# Patient Record
Sex: Female | Born: 1980 | Race: Black or African American | Hispanic: No | Marital: Single | Smoking: Never smoker
Health system: Southern US, Community
[De-identification: ages and names within clinical notes are randomized; demographics above are authoritative.]

## PROBLEM LIST (undated history)

## (undated) ENCOUNTER — Inpatient Hospital Stay (HOSPITAL_COMMUNITY): Payer: Self-pay

## (undated) DIAGNOSIS — C801 Malignant (primary) neoplasm, unspecified: Secondary | ICD-10-CM

## (undated) DIAGNOSIS — R5383 Other fatigue: Secondary | ICD-10-CM

## (undated) DIAGNOSIS — E876 Hypokalemia: Secondary | ICD-10-CM

## (undated) DIAGNOSIS — R55 Syncope and collapse: Secondary | ICD-10-CM

## (undated) DIAGNOSIS — R569 Unspecified convulsions: Secondary | ICD-10-CM

## (undated) DIAGNOSIS — E611 Iron deficiency: Secondary | ICD-10-CM

## (undated) HISTORY — DX: Iron deficiency: E61.1

## (undated) HISTORY — DX: Hypokalemia: E87.6

## (undated) HISTORY — DX: Other fatigue: R53.83

## (undated) HISTORY — PX: NO PAST SURGERIES: SHX2092

---

## 2007-11-27 ENCOUNTER — Inpatient Hospital Stay (HOSPITAL_COMMUNITY): Admission: AD | Admit: 2007-11-27 | Discharge: 2007-11-27 | Payer: Self-pay | Admitting: Family Medicine

## 2010-01-07 ENCOUNTER — Emergency Department (HOSPITAL_COMMUNITY): Admission: EM | Admit: 2010-01-07 | Discharge: 2010-01-07 | Payer: Self-pay | Admitting: Emergency Medicine

## 2011-10-11 ENCOUNTER — Encounter (HOSPITAL_COMMUNITY): Payer: Self-pay | Admitting: Physical Medicine and Rehabilitation

## 2011-10-11 ENCOUNTER — Emergency Department (HOSPITAL_COMMUNITY): Payer: PRIVATE HEALTH INSURANCE

## 2011-10-11 ENCOUNTER — Emergency Department (HOSPITAL_COMMUNITY)
Admission: EM | Admit: 2011-10-11 | Discharge: 2011-10-11 | Disposition: A | Discharge status: Home / Self Care | Payer: PRIVATE HEALTH INSURANCE | Attending: Emergency Medicine | Admitting: Emergency Medicine

## 2011-10-11 DIAGNOSIS — R0602 Shortness of breath: Secondary | ICD-10-CM | POA: Insufficient documentation

## 2011-10-11 DIAGNOSIS — R0789 Other chest pain: Secondary | ICD-10-CM | POA: Insufficient documentation

## 2011-10-11 DIAGNOSIS — R079 Chest pain, unspecified: Secondary | ICD-10-CM

## 2011-10-11 LAB — COMPREHENSIVE METABOLIC PANEL
ALT: 9 U/L (ref 0–35)
BUN: 6 mg/dL (ref 6–23)
CO2: 24 mEq/L (ref 19–32)
Calcium: 9.1 mg/dL (ref 8.4–10.5)
Creatinine, Ser: 0.79 mg/dL (ref 0.50–1.10)
GFR calc Af Amer: >90 mL/min (ref >90)
GFR calc non Af Amer: >90 mL/min (ref >90)
Glucose, Bld: 61 mg/dL — ABNORMAL LOW (ref 70–99)
Sodium: 139 mEq/L (ref 135–145)

## 2011-10-11 LAB — CBC
HCT: 37.6 % (ref 36.0–46.0)
Hemoglobin: 12.6 g/dL (ref 12.0–15.0)
MCH: 29.4 pg (ref 26.0–34.0)
MCV: 87.6 fL (ref 78.0–100.0)
RBC: 4.29 MIL/uL (ref 3.87–5.11)

## 2011-10-11 LAB — D-DIMER, QUANTITATIVE: D-Dimer, Quant: 0.56 ug/mL-FEU — ABNORMAL HIGH (ref 0.00–0.48)

## 2011-10-11 MED ORDER — IOHEXOL 350 MG/ML SOLN
80.0000 mL | Freq: Once | INTRAVENOUS | Status: AC | PRN
  Administered 2011-10-11: 80 mL via INTRAVENOUS

## 2011-10-11 NOTE — ED Notes (Signed)
Pt presents to department for evaluation of diffuse chest pain and SOB. Pt states onset last night while resting at home. States she became diaphoretic, short of breath and began having diffuse chest heaviness. Describes as constant, rating pain at 4/10 at the time. Nothing makes pain worse. Respirations unlabored. Skin warm and dry. She is alert and oriented x4.

## 2011-10-11 NOTE — ED Provider Notes (Signed)
History     CSN: [id number]  Arrival date & time 10/11/11  4098   First MD Initiated Contact with Patient 10/11/11 [phone]      Chief Complaint  Patient presents with  . Chest Pain  . Shortness of Breath    (Consider location/radiation/quality/duration/timing/severity/associated sxs/prior treatment) Patient is a 31 y.o. female presenting with chest pain. The history is provided by the patient.  Chest Pain The chest pain began yesterday (left sided). Duration of episode(s) is 1 minute. Chest pain occurs intermittently. The chest pain is resolved (currently with vague discomfort, not painful, to same area). Associated with: nothing, began at rest. The severity of the pain is severe. The quality of the pain is described as squeezing. The pain does not radiate. Exacerbated by: nothing. Primary symptoms include shortness of breath. Pertinent negatives for primary symptoms include no fever, no syncope, no cough, no wheezing, no palpitations, no abdominal pain, no nausea, no vomiting and no dizziness.  Associated symptoms include diaphoresis.  Pertinent negatives for associated symptoms include no near-syncope. Lower extremity edema: intermittently to feet only, none at present. She tried nothing for the symptoms. Risk factors include oral contraceptive use.  Pertinent negatives for past medical history include no diabetes, no DVT, no hyperlipidemia, no hypertension, no PE and no recent injury. Past medical history comments: neg recent prolonged immobility or surgery  Her family medical history is significant for CAD in family (elderly grandparent).  Pertinent negatives for family medical history include: no early MI in family.     No past medical history on file.  No past surgical history on file.  No family history on file.  History  Substance Use Topics  . Smoking status: Never Smoker   . Smokeless tobacco: Not on file  . Alcohol Use: No     Review of Systems  Constitutional:  Positive for diaphoresis. Negative for fever.  Respiratory: Positive for shortness of breath. Negative for cough and wheezing.   Cardiovascular: Positive for chest pain. Negative for palpitations, syncope and near-syncope.  Gastrointestinal: Negative for nausea, vomiting and abdominal pain.  Genitourinary: Vaginal bleeding: on menses.  Neurological: Negative for dizziness.  All other systems reviewed and are negative.    Allergies  Review of patient's allergies indicates no known allergies.  Home Medications   Current Outpatient Rx  Name Route Sig Dispense Refill  . ETONOGESTREL-ETHINYL ESTRADIOL 0.12-0.015 MG/24HR VA RING Vaginal Place 1 each vaginally every 28 (twenty-eight) days. Insert vaginally and leave in place for 3 consecutive weeks, then remove for 1 week.      BP 166/94  Pulse 81  Temp 97 F (36.1 C) (Oral)  Resp 24  SpO2 96%  Physical Exam  Nursing note and vitals reviewed. Constitutional: She appears well-developed and well-nourished. No distress.  HENT:  Head: Normocephalic and atraumatic.  Right Ear: External ear normal.  Left Ear: External ear normal.  Mouth/Throat: Oropharynx is clear and moist.  Eyes: Conjunctivae are normal. Pupils are equal, round, and reactive to light.  Neck: Neck supple.  Cardiovascular: Normal rate, regular rhythm and normal heart sounds.   No murmur heard.      Bilateral radial and DP pulses are 2+  Pulmonary/Chest: Effort normal and breath sounds normal. No respiratory distress. She has no wheezes. She exhibits no tenderness.  Abdominal: Soft. Bowel sounds are normal. She exhibits no distension. There is no tenderness. There is no guarding.  Musculoskeletal:       Calves supple and non-tender  Neurological: She is alert.  Skin: Skin is warm and dry. She is not diaphoretic.  Psychiatric: She has a normal mood and affect.    ED Course  Procedures (including critical care time)  Labs Reviewed  COMPREHENSIVE METABOLIC PANEL -  Abnormal; Notable for the following:    Potassium 3.4 (*)     Glucose, Bld 61 (*)     Total Bilirubin 0.2 (*)     All other components within normal limits  D-DIMER, QUANTITATIVE - Abnormal; Notable for the following:    D-Dimer, Quant 0.56 (*)     All other components within normal limits  CBC  POCT I-STAT TROPONIN I   Dg Chest 2 View  10/11/2011  *RADIOLOGY REPORT*  Clinical Data: Chest pain  CHEST - 2 VIEW  Comparison: None  Findings: The heart size and mediastinal contours are within normal limits.  Both lungs are clear. There is a scoliosis deformity affecting the thoracic spine.  IMPRESSION: Negative exam.  Original Report Authenticated By: Rosealee Albee, M.D.   Ct Angio Chest W/cm &/or Wo Cm  10/11/2011  *RADIOLOGY REPORT*  Clinical Data: Chest pain and shortness of breath.  Evaluate for pulmonary embolism.  CT ANGIOGRAPHY CHEST  Technique:  Multidetector CT imaging of the chest using the standard protocol during bolus administration of intravenous contrast. Multiplanar reconstructed images including MIPs were obtained and reviewed to evaluate the vascular anatomy.  Contrast: 80mL OMNIPAQUE IOHEXOL 350 MG/ML SOLN  Comparison: No priors.  Findings:  Mediastinum: No filling defects within the pulmonary arterial tree to suggest underlying pulmonary embolism. Heart size is normal. There is no significant pericardial fluid, thickening or pericardial calcification. No pathologically enlarged mediastinal or hilar lymph nodes. Esophagus is unremarkable in appearance.  Lungs/Pleura: No acute consolidative airspace disease.  No pleural effusions.  No suspicious appearing pulmonary nodules or masses.  Upper Abdomen: Unremarkable.  Musculoskeletal: There are no aggressive appearing lytic or blastic lesions noted in the visualized portions of the skeleton.  IMPRESSION: 1.  No evidence of pulmonary embolism. 2.  No acute findings in the thorax to account for the patient's symptoms.  Original Report  Authenticated By: Florencia Reasons, M.D.     Dx 1: Chest pain   MDM  31 y/o F with atypical CP, no known cardiac risk factors. EKG normal, please see attending MD note for documentation. CXR normal. Labs are reviewed, slight hypoglycemia, slight hypokalemia (not requiring supplementation), slightly elevated D-dimer. Given RF for PE of hormonal birth control, CT angio of the chest is performed and shows no pulmonary embolism. Discussed all results with patient. She will be d/c home and agrees to follow-up with primary care.        Shaaron Adler, PA-C 10/11/11 1124

## 2011-10-11 NOTE — ED Provider Notes (Signed)
ECG shows normal sinus rhythm with a rate of 62, no ectopy. Normal axis. Normal P wave. Normal QRS. Normal intervals. Normal ST and T waves. Impression: normal ECG. No old ECG available for comparison.   Dione Booze, MD 10/11/11 [phone]

## 2011-10-12 NOTE — ED Provider Notes (Signed)
Medical screening examination/treatment/procedure(s) were conducted as a shared visit with non-physician practitioner(s) and myself.  I personally evaluated the patient during the encounter   Kyleena Scheirer, MD 10/12/11 2149 

## 2011-11-16 ENCOUNTER — Encounter (HOSPITAL_COMMUNITY): Payer: Self-pay | Admitting: *Deleted

## 2011-11-16 ENCOUNTER — Emergency Department (HOSPITAL_COMMUNITY)
Admission: EM | Admit: 2011-11-16 | Discharge: 2011-11-16 | Disposition: A | Discharge status: Home / Self Care | Payer: Self-pay | Attending: Emergency Medicine | Admitting: Emergency Medicine

## 2011-11-16 DIAGNOSIS — R55 Syncope and collapse: Secondary | ICD-10-CM | POA: Insufficient documentation

## 2011-11-16 HISTORY — DX: Syncope and collapse: R55

## 2011-11-16 HISTORY — DX: Unspecified convulsions: R56.9

## 2011-11-16 LAB — URINALYSIS, ROUTINE W REFLEX MICROSCOPIC
Bilirubin Urine: NEGATIVE
Ketones, ur: NEGATIVE mg/dL
Nitrite: NEGATIVE
pH: 6 (ref 5.0–8.0)

## 2011-11-16 LAB — URINE MICROSCOPIC-ADD ON

## 2011-11-16 LAB — POCT I-STAT, CHEM 8
Chloride: 101 mEq/L (ref 96–112)
Creatinine, Ser: 0.9 mg/dL (ref 0.50–1.10)
Glucose, Bld: 85 mg/dL (ref 70–99)
Potassium: 3.8 mEq/L (ref 3.5–5.1)

## 2011-11-16 MED ORDER — SODIUM CHLORIDE 0.9 % IV BOLUS (SEPSIS)
1000.0000 mL | Freq: Once | INTRAVENOUS | Status: AC
  Administered 2011-11-16: 1000 mL via INTRAVENOUS

## 2011-11-16 MED ORDER — ONDANSETRON HCL 4 MG/2ML IJ SOLN
INTRAMUSCULAR | Status: AC
  Filled 2011-11-16: qty 2

## 2011-11-16 NOTE — ED Provider Notes (Signed)
Medical screening examination/treatment/procedure(s) were conducted as a shared visit with non-physician practitioner(s) and myself.  I personally evaluated the patient during the encounter  Syncope with prodrome of nausea. No chest pain or SOB. No Brugada or prolonged QT.  Glynn Octave, MD 11/16/11 [phone]

## 2011-11-16 NOTE — ED Notes (Signed)
States saw PCP yesterday, spent all day at office with tests, etc. Given IV fluids & told by MD she was dehydrated, sleep deprived, needed a better diet & was believed that it was stress related. Denies CP, palpitations, dizziness. C/o nausea all day, no emesis. Denies any injuries from syncopal episodes today

## 2011-11-16 NOTE — ED Provider Notes (Signed)
History     CSN: [id number]  Arrival date & time 11/16/11  1447   First MD Initiated Contact with Patient 11/16/11 1502      Chief Complaint  Patient presents with  . Loss of Consciousness    (Consider location/radiation/quality/duration/timing/severity/associated sxs/prior treatment) HPI Comments: Patient presents with a syncopal episode x2 today. Patient states that she had a syncopal episode preceded by a wave of nausea 5 days ago. She followed up with her primary care physician yesterday for this and had several tests done. Patient states that she had a negative pregnancy test, blood tests, and EKG. She was given IV fluids. The patient states that her doctor attributed her symptoms to stress and fatigue, instructed her to rest more and eat better. Patient had a syncopal episode just after lunch today while she was at rest. She denies a prodrome however she states that she had felt nauseous all day long. She denies chest pain, palpitations, shortness of breath, or headache. Patient denies injury from fall. Seconds episode was witnessed by EMS. Patient only remembers one episode. Patient's had recent visit and CT angiogram rule out PE on 10/12/2011 after having an episode of chest pain. This was negative. Patient denies family history of heart problems or arrhythmia. No history of cardiac death at young age. Patient otherwise denies medical complaints. She his a runner and has not had any symptoms while exercising. She states that she has been stressed out recently. Onset was acute. Course is resolved. Nothing makes symptoms better or worse.   Patient is a 31 y.o. female presenting with syncope. The history is provided by the patient.  Loss of Consciousness This is a new problem. The current episode started today. The problem occurs daily. The problem has been resolved. Associated symptoms include fatigue and nausea. Pertinent negatives include no abdominal pain, chest pain, coughing, fever,  headaches, myalgias, neck pain, numbness, rash, sore throat, vertigo, visual change, vomiting or weakness. Nothing aggravates the symptoms. She has tried nothing for the symptoms.    Past Medical History  Diagnosis Date  . Syncope   . Seizures     History reviewed. No pertinent past surgical history.  No family history on file.  History  Substance Use Topics  . Smoking status: Never Smoker   . Smokeless tobacco: Not on file  . Alcohol Use: No    OB History    Grav Para Term Preterm Abortions TAB SAB Ect Mult Living                  Review of Systems  Constitutional: Positive for fatigue. Negative for fever.  HENT: Negative for sore throat, rhinorrhea, neck pain and neck stiffness.   Eyes: Negative for redness.  Respiratory: Negative for cough, chest tightness and shortness of breath.   Cardiovascular: Positive for syncope. Negative for chest pain, palpitations and leg swelling.  Gastrointestinal: Positive for nausea. Negative for vomiting, abdominal pain and diarrhea.  Genitourinary: Negative for dysuria and vaginal bleeding.  Musculoskeletal: Negative for myalgias.  Skin: Negative for rash.  Neurological: Positive for syncope. Negative for vertigo, seizures, facial asymmetry, speech difficulty, weakness, light-headedness, numbness and headaches.  Psychiatric/Behavioral: Negative for confusion.    Allergies  Review of patient's allergies indicates no known allergies.  Home Medications   Current Outpatient Rx  Name Route Sig Dispense Refill  . VITAMIN D PO Oral Take 1 tablet by mouth daily.    . ETONOGESTREL-ETHINYL ESTRADIOL 0.12-0.015 MG/24HR VA RING Vaginal Place 1 each vaginally every  28 (twenty-eight) days. Insert vaginally and leave in place for 3 consecutive weeks, then remove for 1 week.    Marland Kitchen FERROUS SULFATE 325 (65 FE) MG PO TABS Oral Take 325 mg by mouth daily with breakfast.    . ADULT MULTIVITAMIN W/MINERALS CH Oral Take 1 tablet by mouth daily.      BP  126/90  Pulse 73  Temp 98.2 F (36.8 C)  Resp 18  SpO2 100%  LMP 11/13/2011  Physical Exam  Nursing note and vitals reviewed. Constitutional: She is oriented to person, place, and time. She appears well-developed and well-nourished.  HENT:  Head: Normocephalic and atraumatic.  Eyes: Conjunctivae are normal. Pupils are equal, round, and reactive to light. Right eye exhibits no discharge. Left eye exhibits no discharge.  Neck: Normal range of motion. Neck supple.  Cardiovascular: Normal rate, regular rhythm, normal heart sounds and intact distal pulses.  Exam reveals no gallop and no friction rub.   No murmur heard. Pulses:      Radial pulses are 2+ on the right side, and 2+ on the left side.  Pulmonary/Chest: Effort normal and breath sounds normal. No respiratory distress. She has no wheezes. She has no rales.  Abdominal: Soft. There is no tenderness. There is no rebound and no guarding.  Musculoskeletal: She exhibits no edema and no tenderness.  Neurological: She is alert and oriented to person, place, and time. She has normal strength. No cranial nerve deficit or sensory deficit. Coordination and gait normal. GCS eye subscore is 4. GCS verbal subscore is 5. GCS motor subscore is 6.  Skin: Skin is warm and dry.  Psychiatric: She has a normal mood and affect.    ED Course  Procedures (including critical care time)  Labs Reviewed  URINALYSIS, ROUTINE W REFLEX MICROSCOPIC - Abnormal; Notable for the following:    Hgb urine dipstick SMALL (*)     Protein, ur 30 (*)     All other components within normal limits  POCT I-STAT, CHEM 8 - Abnormal; Notable for the following:    BUN 5 (*)     Calcium, Ion 1.26 (*)     All other components within normal limits  URINE MICROSCOPIC-ADD ON - Abnormal; Notable for the following:    Squamous Epithelial / LPF FEW (*)     Casts HYALINE CASTS (*)     All other components within normal limits  PREGNANCY, URINE   No results found.   1.  Syncope     3:21 PM Patient seen and examined. Work-up initiated. Will monitor. Previous ED visit reviewed.   Vital signs reviewed and are as follows: Filed Vitals:   11/16/11 1519  BP: 126/90  Pulse: 73  Temp:   Resp: 18    Date: 11/16/2011  Rate: 85  Rhythm: normal sinus rhythm  QRS Axis: normal  Intervals: normal  ST/T Wave abnormalities: early repolarization  Conduction Disutrbances:none  Narrative Interpretation: No obvious Brugada, no QTc prolongation, no WPW.   Old EKG Reviewed: none available -- no scan from 10/2011, interpretation by EDP normal.   4:18 PM Curbside Dr. Ladona Ridgel, agrees no concerning findings on EKG. Dr. Manus Gunning has seen.  5:31 PM Work-up largely negative. 1L NS given due to orthostatic vitals. Patient informed of results. She was urged to followup with her primary care physician given another episode of syncope, change from previous. Will also give cardiology referral. Patient urged to return if she has another episode that is different than her previous or she  has any other concerns. Patient verbalizes understanding and agrees with this plan. I urged her to hydrate well and rest.   MDM  Patient with multiple syncopal episodes over the past week. She has had PCP followup with no concerning findings. Workup here today is largely unconcerning. Mild orthostasis, fluids given. Patient is stable. No abnormalities while on cardiac monitor. EKG did not show elevated QTC, Brugada syndrome, Wolff-Parkinson-White, or any other significant abnormalities. Patient is stable for discharge home. Do not suspect pulmonary embolism. Neg recent workup, no chest pain, no tachycardia, shortness of breath.        [place], [place] 11/16/11 [phone]

## 2011-11-16 NOTE — ED Notes (Addendum)
Pt reports had a syncopal epiasode while at work. C/o nausea x 2 weeks. Had another syncopal episode last Saturday. Saw PCP yesterday for same. Denies CP, palpitations. Pt admits to being under a lot of stress lately. Given zofran 4mg  IV per EMS

## 2012-04-17 ENCOUNTER — Encounter (HOSPITAL_COMMUNITY): Payer: Self-pay | Admitting: *Deleted

## 2012-04-17 ENCOUNTER — Emergency Department (HOSPITAL_COMMUNITY)
Admission: EM | Admit: 2012-04-17 | Discharge: 2012-04-17 | Disposition: A | Discharge status: Home / Self Care | Payer: Self-pay | Attending: Emergency Medicine | Admitting: Emergency Medicine

## 2012-04-17 DIAGNOSIS — R55 Syncope and collapse: Secondary | ICD-10-CM | POA: Insufficient documentation

## 2012-04-17 DIAGNOSIS — R51 Headache: Secondary | ICD-10-CM | POA: Insufficient documentation

## 2012-04-17 DIAGNOSIS — Z79899 Other long term (current) drug therapy: Secondary | ICD-10-CM | POA: Insufficient documentation

## 2012-04-17 DIAGNOSIS — Z3202 Encounter for pregnancy test, result negative: Secondary | ICD-10-CM | POA: Insufficient documentation

## 2012-04-17 DIAGNOSIS — Z859 Personal history of malignant neoplasm, unspecified: Secondary | ICD-10-CM | POA: Insufficient documentation

## 2012-04-17 HISTORY — DX: Malignant (primary) neoplasm, unspecified: C80.1

## 2012-04-17 LAB — CBC WITH DIFFERENTIAL/PLATELET
Basophils Absolute: 0 10*3/uL (ref 0.0–0.1)
Eosinophils Relative: 0 % (ref 0–5)
Lymphocytes Relative: 23 % (ref 12–46)
MCV: 86.5 fL (ref 78.0–100.0)
Neutro Abs: 4.6 10*3/uL (ref 1.7–7.7)
Neutrophils Relative %: 73 % (ref 43–77)
Platelets: 340 10*3/uL (ref 150–400)
RBC: 4.37 MIL/uL (ref 3.87–5.11)
RDW: 13.8 % (ref 11.5–15.5)
WBC: 6.4 10*3/uL (ref 4.0–10.5)

## 2012-04-17 LAB — BASIC METABOLIC PANEL
CO2: 23 mEq/L (ref 19–32)
Calcium: 9.2 mg/dL (ref 8.4–10.5)
GFR calc non Af Amer: >90 mL/min (ref >90)
Potassium: 3.5 mEq/L (ref 3.5–5.1)
Sodium: 137 mEq/L (ref 135–145)

## 2012-04-17 LAB — POCT PREGNANCY, URINE: Preg Test, Ur: NEGATIVE

## 2012-04-17 MED ORDER — SODIUM CHLORIDE 0.9 % IV BOLUS (SEPSIS)
500.0000 mL | Freq: Once | INTRAVENOUS | Status: AC
  Administered 2012-04-17: 500 mL via INTRAVENOUS

## 2012-04-17 NOTE — ED Notes (Signed)
MD at bedside to update patient

## 2012-04-17 NOTE — ED Notes (Signed)
Pt in via EMS, per EMS, pt in s/p syncopal episode at work, states she was standing with a co-worker and they state she started to wobble, was caught and assisted to ground by co-worker, pt was unresponsive upon EMS arrival with GCS of 4. Within a few minutes pt was alert and oriented. Pt states she has eaten today, denies being sick, states she had a headache this am.

## 2012-04-17 NOTE — ED Notes (Signed)
D/C instructions reviewed with patient, denies questions, pt home with ride.

## 2012-04-17 NOTE — ED Provider Notes (Signed)
History     CSN: [id number]  Arrival date & time 04/17/12  1250   First MD Initiated Contact with Patient 04/17/12 1254      Chief Complaint  Patient presents with  . Loss of Consciousness    (Consider location/radiation/quality/duration/timing/severity/associated sxs/prior treatment) HPI Comments: Patient comes to the ER for evaluation of syncope. Patient reports that she had an episode of sudden loss of consciousness while at work earlier today. She says that she was performing her normal job when she suddenly started feeling weak. Bystanders that she started to walk behind a coworker grabbed her and later to the ground. The patient was reportedly unresponsive for several minutes. Upon EMS arrival she was completely unresponsive but rapidly aroused and is now back to her normal baseline. Patient reports that this happened several months ago she had a very for followup including neurology consult and there were no findings.  Does report that she woke up with a slight headache this morning. She says she thought that she was mildly dehydrated and has been drinking a lot of water. She has not had any blurred vision. No chest pain, shortness of breath, abdominal pain, nausea, vomiting or diarrhea.  Patient is a 32 y.o. female presenting with syncope.  Loss of Consciousness Associated symptoms include headaches.    Past Medical History  Diagnosis Date  . Syncope   . Seizures   . Cancer     History reviewed. No pertinent past surgical history.  History reviewed. No pertinent family history.  History  Substance Use Topics  . Smoking status: Never Smoker   . Smokeless tobacco: Not on file  . Alcohol Use: No    OB History    Grav Para Term Preterm Abortions TAB SAB Ect Mult Living                  Review of Systems  Cardiovascular: Positive for syncope.  Neurological: Positive for syncope and headaches.  All other systems reviewed and are negative.    Allergies  Review  of patient's allergies indicates no known allergies.  Home Medications   Current Outpatient Rx  Name  Route  Sig  Dispense  Refill  . VITAMIN D PO   Oral   Take 1 tablet by mouth daily.         . ETONOGESTREL-ETHINYL ESTRADIOL 0.12-0.015 MG/24HR VA RING   Vaginal   Place 1 each vaginally every 28 (twenty-eight) days. Insert vaginally and leave in place for 3 consecutive weeks, then remove for 1 week.         Marland Kitchen FERROUS SULFATE 325 (65 FE) MG PO TABS   Oral   Take 325 mg by mouth daily with breakfast.         . ADULT MULTIVITAMIN W/MINERALS CH   Oral   Take 1 tablet by mouth daily.           BP 137/88  Pulse 100  Temp 98.5 F (36.9 C) (Oral)  Resp 20  SpO2 100%  Physical Exam  Constitutional: She is oriented to person, place, and time. She appears well-developed and well-nourished. No distress.  HENT:  Head: Normocephalic and atraumatic.  Right Ear: Hearing normal.  Nose: Nose normal.  Mouth/Throat: Oropharynx is clear and moist and mucous membranes are normal.  Eyes: Conjunctivae normal and EOM are normal. Pupils are equal, round, and reactive to light.  Neck: Normal range of motion. Neck supple.  Cardiovascular: Normal rate, regular rhythm, S1 normal and S2 normal.  Exam reveals no gallop and no friction rub.   No murmur heard. Pulmonary/Chest: Effort normal and breath sounds normal. No respiratory distress. She exhibits no tenderness.  Abdominal: Soft. Normal appearance and bowel sounds are normal. There is no hepatosplenomegaly. There is no tenderness. There is no rebound, no guarding, no tenderness at McBurney's point and negative Murphy's sign. No hernia.  Musculoskeletal: Normal range of motion.  Neurological: She is alert and oriented to person, place, and time. She has normal strength. No cranial nerve deficit or sensory deficit. Coordination normal. GCS eye subscore is 4. GCS verbal subscore is 5. GCS motor subscore is 6.  Skin: Skin is warm, dry and  intact. No rash noted. No cyanosis.  Psychiatric: She has a normal mood and affect. Her speech is normal and behavior is normal. Thought content normal.    ED Course  Procedures (including critical care time)  Labs Reviewed - No data to display No results found.   Diagnosis: Syncope    MDM  Patient presents to the ER for evaluation of syncope. Patient has a previous history of similar episodes. He was briefly unresponsive but now is back to her normal baseline. She has a normal examination here in the ER. Basic labs were unremarkable. Patient has been observed here in the ER for a period of time and continues to do well. She'll be discharged home, followup as needed.       Gilda Crease, MD 04/17/12 [phone]

## 2012-06-08 ENCOUNTER — Emergency Department (HOSPITAL_COMMUNITY)
Admission: EM | Admit: 2012-06-08 | Discharge: 2012-06-08 | Discharge status: Against Medical Advice | Payer: Self-pay | Attending: Emergency Medicine | Admitting: Emergency Medicine

## 2012-06-08 DIAGNOSIS — R55 Syncope and collapse: Secondary | ICD-10-CM | POA: Insufficient documentation

## 2012-06-08 NOTE — ED Notes (Signed)
As getting report pt ambulatory to nurses station stating she does not wish to stay "I feel fine, and I will follow up with my doctor". RN explained that she will be signing out against medical advice. Dr. Rubin Payor notified and spoke to pt as well. Risks and benefits explained and pt verbalized understanding.

## 2012-06-08 NOTE — ED Notes (Signed)
Per ems- pt with hx of ovarian cancer and taking chemo pill. States that these episodes have been happening lately but everytime everything checks out okay. Episodes only last a few seconds with dizziness right before. States its usually when her bp gets low however ems bp was elevated. No orthostatic changes. Pt nsr. 20 G L AC.

## 2012-06-08 NOTE — ED Provider Notes (Signed)
MSE was initiated and I personally evaluated the patient and placed orders (if any) at  10:25 PM on June 08, 2012.  Patient was brought in after syncope. She does not want any further evaluation. She states she's been worked up for this before. She is aware that this could be something life-threatening and she does not want further evaluation. She appears awake and appropriate appears to have the capacity to make this decision. She is leaving AGAINST MEDICAL ADVICE  Juliet Rude. Rubin Payor, MD 06/08/12 2226

## 2012-08-08 ENCOUNTER — Emergency Department (HOSPITAL_COMMUNITY)
Admission: EM | Admit: 2012-08-08 | Discharge: 2012-08-08 | Disposition: A | Discharge status: Home / Self Care | Payer: Self-pay | Attending: Emergency Medicine | Admitting: Emergency Medicine

## 2012-08-08 ENCOUNTER — Encounter (HOSPITAL_COMMUNITY): Payer: Self-pay | Admitting: Emergency Medicine

## 2012-08-08 DIAGNOSIS — R55 Syncope and collapse: Secondary | ICD-10-CM | POA: Insufficient documentation

## 2012-08-08 DIAGNOSIS — Z859 Personal history of malignant neoplasm, unspecified: Secondary | ICD-10-CM | POA: Insufficient documentation

## 2012-08-08 NOTE — ED Notes (Signed)
Pt was at work this am and was found having seizure activity; twitching eye movement.  Non-responsive to light.  Last approx. 20 minutes.  Pt has recent hx of seizures approx. Every 3 months.  No seizure diagnosis.   Pt went to [facility] this AM to receive radiation and was unable to be completed d/t high BP.

## 2012-08-08 NOTE — ED Provider Notes (Signed)
History     CSN: [id number]  Arrival date & time 08/08/12  1252   First MD Initiated Contact with Patient 08/08/12 1333      Chief Complaint  Patient presents with  . Seizures    (Consider location/radiation/quality/duration/timing/severity/associated sxs/prior treatment) HPI Comments: Marcia Hanson is a 32 y.o. Female was at work today when she passed out. Coworkers found her in the bathroom unconscious. They carried her into another room. EMS arrived and found her unconscious. She appeared to have fluttering eyelids, at that time. She did not respond to painful stimuli. During transport to the ER, she suddenly woke up. There was no gradual improving mental status. The paramedic that was attending her described, "it was like a light switch turned on". The patient presents to the ED asymptomatic. She feels back to her normal baseline.she has had frequent episodes like this. She states that she has had 2 EEGs done in the last year by a neurologist. These EEGs were normal. She states that she ate twice the day prior to the episode. She denies recent illnesses, including fever, chills, nausea, vomiting, weakness, dizziness, abnormal vaginal bleeding, dysuria, or change in bowel habits. She has NuvaRing protection for pregnancy. There are no modifying factors.  Patient is a 32 y.o. female presenting with seizures. The history is provided by the patient.  Seizures   Past Medical History  Diagnosis Date  . Syncope   . Seizures   . Cancer     No past surgical history on file.  No family history on file.  History  Substance Use Topics  . Smoking status: Never Smoker   . Smokeless tobacco: Not on file  . Alcohol Use: No    OB History   Grav Para Term Preterm Abortions TAB SAB Ect Mult Living                  Review of Systems  Neurological: Positive for seizures.  All other systems reviewed and are negative.    Allergies  Avocado  Home Medications   Current  Outpatient Rx  Name  Route  Sig  Dispense  Refill  . etonogestrel-ethinyl estradiol (NUVARING) 0.12-0.015 MG/24HR vaginal ring   Vaginal   Place 1 each vaginally every 28 (twenty-eight) days. Insert vaginally and leave in place for 3 consecutive weeks, then remove for 1 week.           BP 150/102  Pulse 80  Temp(Src) 98 F (36.7 C) (Oral)  Resp 18  SpO2 99%  Physical Exam  Nursing note and vitals reviewed. Constitutional: She is oriented to person, place, and time. She appears well-developed and well-nourished.  HENT:  Head: Normocephalic and atraumatic.  Eyes: Conjunctivae and EOM are normal. Pupils are equal, round, and reactive to light.  Neck: Normal range of motion and phonation normal. Neck supple.  Cardiovascular: Normal rate, regular rhythm and intact distal pulses.   Pulmonary/Chest: Effort normal and breath sounds normal. She exhibits no tenderness.  Abdominal: Soft. She exhibits no distension. There is no tenderness. There is no guarding.  Musculoskeletal: Normal range of motion.  Neurological: She is alert and oriented to person, place, and time. She has normal strength. She exhibits normal muscle tone.  Skin: Skin is warm and dry.  Psychiatric: She has a normal mood and affect. Her behavior is normal. Judgment and thought content normal.    ED Course  Procedures (including critical care time)  Reevaluation: 15:20- no seizure, and no change in status during the  ED evaluation.   Date: 1300  Rate: 81  Rhythm: normal sinus rhythm  QRS Axis: normal  PR and QT Intervals: normal  ST/T Wave abnormalities: early repolarization  PR and QRS Conduction Disutrbances:none  Narrative Interpretation:   Old EKG Reviewed: unchanged-11/16/11       1. Syncope       MDM  Syncope, recurrent; previously evaluated. No clear cause for syncope today. Doubt seizure, CVA, iron depletion, metabolic process or infectious process. She is stable for discharge  Nursing Notes  Reviewed/ Care Coordinated, and agree without changes. Applicable Imaging Reviewed.  Interpretation of Laboratory Data incorporated into ED treatment   Plan: Home Medications- Tylenol, when necessary; Home Treatments- rest, fluids; Recommended follow up- PCP, one week for checkup          Flint Melter, MD 08/08/12 1524

## 2012-08-08 NOTE — ED Notes (Signed)
MD at bedside. 

## 2014-05-19 ENCOUNTER — Emergency Department: Payer: Self-pay | Admitting: Emergency Medicine

## 2014-08-24 ENCOUNTER — Encounter: Payer: Self-pay | Admitting: Emergency Medicine

## 2014-08-24 ENCOUNTER — Emergency Department
Admission: EM | Admit: 2014-08-24 | Discharge: 2014-08-24 | Disposition: A | Discharge status: Home / Self Care | Payer: Self-pay | Attending: Emergency Medicine | Admitting: Emergency Medicine

## 2014-08-24 DIAGNOSIS — T783XXA Angioneurotic edema, initial encounter: Secondary | ICD-10-CM | POA: Insufficient documentation

## 2014-08-24 DIAGNOSIS — Z793 Long term (current) use of hormonal contraceptives: Secondary | ICD-10-CM | POA: Insufficient documentation

## 2014-08-24 DIAGNOSIS — Y9389 Activity, other specified: Secondary | ICD-10-CM | POA: Insufficient documentation

## 2014-08-24 DIAGNOSIS — X58XXXA Exposure to other specified factors, initial encounter: Secondary | ICD-10-CM | POA: Insufficient documentation

## 2014-08-24 DIAGNOSIS — Y998 Other external cause status: Secondary | ICD-10-CM | POA: Insufficient documentation

## 2014-08-24 DIAGNOSIS — Y9289 Other specified places as the place of occurrence of the external cause: Secondary | ICD-10-CM | POA: Insufficient documentation

## 2014-08-24 DIAGNOSIS — Z79899 Other long term (current) drug therapy: Secondary | ICD-10-CM | POA: Insufficient documentation

## 2014-08-24 MED ORDER — SODIUM CHLORIDE 0.9 % IV BOLUS (SEPSIS)
500.0000 mL | Freq: Once | INTRAVENOUS | Status: AC
  Administered 2014-08-24: 500 mL via INTRAVENOUS

## 2014-08-24 MED ORDER — PREDNISONE 10 MG (21) PO TBPK: ORAL_TABLET | ORAL | Status: DC

## 2014-08-24 MED ORDER — DEXAMETHASONE SODIUM PHOSPHATE 10 MG/ML IJ SOLN
10.0000 mg | Freq: Once | INTRAMUSCULAR | Status: AC
  Administered 2014-08-24: 10 mg via INTRAVENOUS

## 2014-08-24 MED ORDER — RANITIDINE HCL 150 MG PO TABS: 150.0000 mg | ORAL_TABLET | Freq: Two times a day (BID) | ORAL | Status: DC

## 2014-08-24 MED ORDER — DEXAMETHASONE SODIUM PHOSPHATE 10 MG/ML IJ SOLN
INTRAMUSCULAR | Status: AC
  Filled 2014-08-24: qty 1

## 2014-08-24 MED ORDER — RANITIDINE HCL 50 MG/2ML IJ SOLN
50.0000 mg | Freq: Once | INTRAVENOUS | Status: AC
  Administered 2014-08-24: 50 mg via INTRAVENOUS
  Filled 2014-08-24: qty 2

## 2014-08-24 NOTE — Discharge Instructions (Signed)
Angioedema Angioedema is sudden puffiness (swelling), often of the skin. It can happen:  On your face or privates (genitals).  In your belly (abdomen) or other body parts. It usually happens quickly and gets better in 1 or 2 days. It often starts at night and is found when you wake up. You may get red, itchy patches of skin (hives). Attacks can be dangerous if your breathing passages get puffy. The condition may happen only once, or it can come back at random times. It may happen for several years before it goes away for good. HOME CARE  Only take medicines as told by your doctor.  Always carry your emergency allergy medicines with you.  Wear a medical bracelet as told by your doctor.  Avoid things that you know will cause attacks (triggers). GET HELP IF:  You have another attack.  Your attacks happen more often or get worse.  The condition was passed to you by your parents and you want to have children. GET HELP RIGHT AWAY IF:   Your mouth, tongue, or lips are very puffy.  You have trouble breathing.  You have trouble swallowing.  You pass out (faint). MAKE SURE YOU:   Understand these instructions.  Will watch your condition.  Will get help right away if you are not doing well or get worse. Document Released: 03/15/2009 Document Revised: 01/15/2013 Document Reviewed: 11/18/2012 [facility] Patient Information 2015 [place], [place]. This information is not intended to replace advice given to you by your health care provider. Make sure you discuss any questions you have with your health care provider. RETURN TO ER IMMEDIATELY  IF ANY PROBLEMS   TAKE PREDNISONE AS DIRECTED.  ZANTAC TWICE A DAY.

## 2014-08-24 NOTE — ED Notes (Addendum)
Patient presents to ED with upper lip swelling. Denies knew medications or foods. Reports acute onset. Denies throat swelling. Reports onset after eating breakfast at home; states she ate the same thing she ate yesterday for breakfast. States she took Benadryl prior to arrival (does not know dose). Patient speaking in complete sentences without difficulty.

## 2014-08-24 NOTE — ED Provider Notes (Signed)
[facility] Emergency Department Provider Note  ____________________________________________  Time seen: 9:18 AM I have reviewed the triage vital signs and the nursing notes.   HISTORY  Chief Complaint Oral Swelling   HPI Marcia Hanson is a 34 y.o. female patient comes in today with complaint of upper right lip swelling this morning. 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 This morning she ate an egg quite and later began feeling like her lip is non-. She denies any problems breathing or swallowing. She is able to talk in full sentences. Currently she denies any pain. She has taken 2 Benadryl prior to arrival in the emergency room which has helped a little. She denies any previous anaphylaxis to foods.   Past Medical History  Diagnosis Date  . Syncope   . Seizures   . Cancer     Patient Active Problem List   Diagnosis Date Noted  . Syncope     No past surgical history on file.  Current Outpatient Rx  Name  Route  Sig  Dispense  Refill  . etonogestrel-ethinyl estradiol (NUVARING) 0.12-0.015 MG/24HR vaginal ring   Vaginal   Place 1 each vaginally every 28 (twenty-eight) days. Insert vaginally and leave in place for 3 consecutive weeks, then remove for 1 week.         . Multiple Vitamin (MULTIVITAMIN WITH MINERALS) TABS   Oral   Take 1 tablet by mouth daily.         . predniSONE (STERAPRED UNI-PAK 21 TAB) 10 MG (21) TBPK tablet      As directed   21 tablet   0   . ranitidine (ZANTAC) 150 MG tablet   Oral   Take 1 tablet (150 mg total) by mouth 2 (two) times daily.   60 tablet   1     Allergies Avocado  No family history on file.  Social History History  Substance Use Topics  . Smoking status: Never Smoker   . Smokeless tobacco: Not on file  . Alcohol Use: No    Review of Systems Constitutional: No fever/chills Eyes: No visual changes. ENT: No sore throat. Cardiovascular: Denies chest pain. Respiratory: Denies  shortness of breath. Gastrointestinal: No abdominal pain.  No nausea, no vomiting. Genitourinary: Negative for dysuria. Musculoskeletal: Negative for back pain. Skin: Negative for rash. Neurological: Negative for headaches, focal weakness or numbness. 10-point ROS otherwise negative.  ____________________________________________   PHYSICAL EXAM:  VITAL SIGNS: ED Triage Vitals  Enc Vitals Group     BP --      Pulse --      Resp --      Temp 08/24/14 0914 98.2 F (36.8 C)     Temp Source 08/24/14 0914 Oral     SpO2 08/24/14 0914 100 %     Weight 08/24/14 0914 129 lb (58.514 kg)     Height 08/24/14 0914 5\' 4"  (1.626 m)     Head Cir --      Peak Flow --      Pain Score --      Pain Loc --      Pain Edu? --      Excl. in [place]? --     Constitutional: Alert and oriented. Well appearing and in no acute distress. Eyes: Conjunctivae are normal. PERRL. EOMI. Head: Atraumatic. Nose: No congestion/rhinnorhea. Mouth/Throat: Mucous membranes are moist.  Oropharynx non-erythematous. No edema noted airway is clear. Neck: No stridor.  Supple without adenopathy. Cardiovascular: Normal rate, regular rhythm.  Grossly normal heart sounds.  Good peripheral circulation. Respiratory: Normal respiratory effort.  No retractions. Lungs CTAB. No wheezes are noted. Gastrointestinal: Soft and nontender. No distention. Musculoskeletal: No lower extremity tenderness nor edema.  No joint effusions. Neurologic:  Normal speech and language. No gross focal neurologic deficits are appreciated. Speech is normal. No gait instability. Skin:  Skin is warm, dry and intact. No rash noted. No hives Psychiatric: Mood and affect are normal. Speech and behavior are normal.  ____________________________________________   LABS (all labs ordered are listed, but only abnormal results are displayed)  Labs Reviewed - No data to display ____________________________________________  PROCEDURES  Procedure(s) performed:  None  Critical Care performed: No  ____________________________________________   INITIAL IMPRESSION / ASSESSMENT AND PLAN / ED COURSE  Pertinent labs & imaging results that were available during my care of the patient were reviewed by me and considered in my medical decision making (see chart for details).  Patient was given IV Decadron along with IV Zantac. There was improvement of her swollen lip. Reexamination 2 did not reveal any wheezing, difficulty breathing, difficulty swallowing or talking. Patient was discharged on a prednisone taper along with prescription for Zantac. She is return to the emergency room immediately if any severe worsening urgent concerns. Patient is also to follow up with her doctor for a food allergy testing if this occurs again ____________________________________________   FINAL CLINICAL IMPRESSION(S) / ED DIAGNOSES  Final diagnoses:  Angioedema of lips, initial encounter      Johnn Hai, PA-C 08/24/14 1223  Johnn Hai, PA-C 08/24/14 [place], MD 08/25/14 [phone]

## 2014-09-12 ENCOUNTER — Other Ambulatory Visit: Payer: Self-pay

## 2014-09-12 ENCOUNTER — Encounter: Payer: Self-pay | Admitting: Emergency Medicine

## 2014-09-12 ENCOUNTER — Emergency Department
Admission: EM | Admit: 2014-09-12 | Discharge: 2014-09-12 | Disposition: A | Discharge status: Home / Self Care | Payer: Self-pay | Attending: Emergency Medicine | Admitting: Emergency Medicine

## 2014-09-12 DIAGNOSIS — Z79899 Other long term (current) drug therapy: Secondary | ICD-10-CM | POA: Insufficient documentation

## 2014-09-12 DIAGNOSIS — R55 Syncope and collapse: Secondary | ICD-10-CM | POA: Insufficient documentation

## 2014-09-12 DIAGNOSIS — Z793 Long term (current) use of hormonal contraceptives: Secondary | ICD-10-CM | POA: Insufficient documentation

## 2014-09-12 DIAGNOSIS — Z3202 Encounter for pregnancy test, result negative: Secondary | ICD-10-CM | POA: Insufficient documentation

## 2014-09-12 LAB — COMPREHENSIVE METABOLIC PANEL
ALK PHOS: 43 U/L (ref 38–126)
ALT: 10 U/L — AB (ref 14–54)
ANION GAP: 7 (ref 5–15)
AST: 15 U/L (ref 15–41)
Albumin: 3.7 g/dL (ref 3.5–5.0)
BILIRUBIN TOTAL: 0.6 mg/dL (ref 0.3–1.2)
BUN: 10 mg/dL (ref 6–20)
CO2: 25 mmol/L (ref 22–32)
CREATININE: 0.77 mg/dL (ref 0.44–1.00)
Calcium: 8.8 mg/dL — ABNORMAL LOW (ref 8.9–10.3)
Chloride: 107 mmol/L (ref 101–111)
Glucose, Bld: 89 mg/dL (ref 65–99)
Potassium: 3.6 mmol/L (ref 3.5–5.1)
SODIUM: 139 mmol/L (ref 135–145)
Total Protein: 8 g/dL (ref 6.5–8.1)

## 2014-09-12 LAB — URINALYSIS COMPLETE WITH MICROSCOPIC (ARMC ONLY)
BILIRUBIN URINE: NEGATIVE
Glucose, UA: NEGATIVE mg/dL
LEUKOCYTES UA: NEGATIVE
NITRITE: NEGATIVE
PH: 5 (ref 5.0–8.0)
PROTEIN: NEGATIVE mg/dL
SPECIFIC GRAVITY, URINE: 1.026 (ref 1.005–1.030)

## 2014-09-12 LAB — CBC
HEMATOCRIT: 38.5 % (ref 35.0–47.0)
Hemoglobin: 12.9 g/dL (ref 12.0–16.0)
MCH: 29.7 pg (ref 26.0–34.0)
MCHC: 33.5 g/dL (ref 32.0–36.0)
MCV: 88.7 fL (ref 80.0–100.0)
Platelets: 305 10*3/uL (ref 150–440)
RBC: 4.34 MIL/uL (ref 3.80–5.20)
RDW: 13.5 % (ref 11.5–14.5)
WBC: 8 10*3/uL (ref 3.6–11.0)

## 2014-09-12 LAB — POCT PREGNANCY, URINE: PREG TEST UR: NEGATIVE

## 2014-09-12 LAB — TROPONIN I: Troponin I: <0.03 ng/mL (ref <0.031)

## 2014-09-12 MED ORDER — SODIUM CHLORIDE 0.9 % IV BOLUS (SEPSIS)
1000.0000 mL | Freq: Once | INTRAVENOUS | Status: AC
  Administered 2014-09-12: 1000 mL via INTRAVENOUS

## 2014-09-12 NOTE — Discharge Instructions (Signed)
No certain cause was found for your episode of passing out, however your exam and evaluation are reassuring. Follow up with the primary care doctor for further follow-up and management of this episode. Return to the emergency room for any new or worsening condition including any pain, chest discomfort, trouble breathing, weakness, numbness, fever, dizziness, passing out, or any other symptoms concerning to you.  After discussing the unusual intermittent welts and lip swelling, I do think he should follow up with the ENT, Dr. Tami Ribas, for further allergy testing.     Syncope Syncope means a person passes out (faints). The person usually wakes up in less than 5 minutes. It is important to seek medical care for syncope. HOME CARE  Have someone stay with you until you feel normal.  Do not drive, use machines, or play sports until your doctor says it is okay.  Keep all doctor visits as told.  Lie down when you feel like you might pass out. Take deep breaths. Wait until you feel normal before standing up.  Drink enough fluids to keep your pee (urine) clear or pale yellow.  If you take blood pressure or heart medicine, get up slowly. Take several minutes to sit and then stand. GET HELP RIGHT AWAY IF:   You have a severe headache.  You have pain in the chest, belly (abdomen), or back.  You are bleeding from the mouth or butt (rectum).  You have black or tarry poop (stool).  You have an irregular or very fast heartbeat.  You have pain with breathing.  You keep passing out, or you have shaking (seizures) when you pass out.  You pass out when sitting or lying down.  You feel confused.  You have trouble walking.  You have severe weakness.  You have vision problems. If you fainted, call your local emergency services (911 in [place] Do not drive yourself to the hospital. MAKE SURE YOU:   Understand these instructions.  Will watch your condition.  Will get help right away if you  are not doing well or get worse. Document Released: 09/13/2007 Document Revised: 09/26/2011 Document Reviewed: 05/26/2011 [facility] Patient Information 2015 [place], [place]. This information is not intended to replace advice given to you by your health care provider. Make sure you discuss any questions you have with your health care provider.

## 2014-09-12 NOTE — ED Notes (Signed)
Pt in bed. Alert and oriented. Questions answered. Will continue to monitor.

## 2014-09-12 NOTE — ED Provider Notes (Signed)
[facility] Emergency Department Provider Note   ____________________________________________  Time seen: 11 AM I have reviewed the triage vital signs and the triage nursing note.  HISTORY  Chief Complaint Loss of Consciousness   Historian Patient  HPI Marcia Hanson is a 34 y.o. female who came in after passing out. She was sitting at her desk and she had felt fatigued this morning although she did have a full night's sleep and eat breakfast. She did have any palpitations or chest pain. No shortness breath. No recent vomiting or diarrhea. She has passed out once before and no certain cause was found. She didn't know that she passed out but woke up with the EMS there. Reportedly a coworker saw her on with her head on the desk. She is currently feeling well but a little bit generalized fatigue. She sustained no injuries. She reports that week ago she was in the emergency department with what sounds like lip angioedema due to an uncertain cause. She's also had some issues with on and off body welts that she thought were bug bites but now she is thinking that they may have been related to an allergic reaction given that they did not come up during the time that she was on prednisone for the lip angioedema. No skin rash or symptoms of allergic reaction today.   Past Medical History  Diagnosis Date  . Syncope   . Seizures   . Cancer     Patient Active Problem List   Diagnosis Date Noted  . Syncope     History reviewed. No pertinent past surgical history.  Current Outpatient Rx  Name  Route  Sig  Dispense  Refill  . etonogestrel-ethinyl estradiol (NUVARING) 0.12-0.015 MG/24HR vaginal ring   Vaginal   Place 1 each vaginally every 28 (twenty-eight) days. Insert vaginally and leave in place for 3 consecutive weeks, then remove for 1 week.         . Multiple Vitamin (MULTIVITAMIN WITH MINERALS) TABS   Oral   Take 1 tablet by mouth daily.         .  predniSONE (STERAPRED UNI-PAK 21 TAB) 10 MG (21) TBPK tablet      As directed   21 tablet   0   . ranitidine (ZANTAC) 150 MG tablet   Oral   Take 1 tablet (150 mg total) by mouth 2 (two) times daily.   60 tablet   1     Allergies Avocado  History reviewed. No pertinent family history.  Social History History  Substance Use Topics  . Smoking status: Never Smoker   . Smokeless tobacco: Not on file  . Alcohol Use: No    Review of Systems  Constitutional: Negative for fever. Eyes: Negative for visual changes. ENT: Negative for sore throat. Cardiovascular: Negative for chest pain. Respiratory: Negative for shortness of breath. Gastrointestinal: Negative for abdominal pain, vomiting and diarrhea. Genitourinary: Negative for dysuria. Musculoskeletal: Negative for back pain. Skin: Negative for rash. Neurological: Negative for headaches, focal weakness or numbness.  ____________________________________________   PHYSICAL EXAM:  VITAL SIGNS: ED Triage Vitals  Enc Vitals Group     BP 09/12/14 1027 151/102 mmHg     Pulse Rate 09/12/14 1027 89     Resp 09/12/14 1027 18     Temp 09/12/14 1027 97.9 F (36.6 C)     Temp Source 09/12/14 1027 Oral     SpO2 09/12/14 1027 100 %     Weight 09/12/14 1027 128 lb (  58.06 kg)     Height 09/12/14 1027 5\' 4"  (1.626 m)     Head Cir --      Peak Flow --      Pain Score --      Pain Loc --      Pain Edu? --      Excl. in [place]? --      Constitutional: Alert and oriented. Well appearing and in no distress. Eyes: Conjunctivae are normal. PERRL. Normal extraocular movements. ENT   Head: Normocephalic and atraumatic.   Nose: No congestion/rhinnorhea.   Mouth/Throat: Mucous membranes are moist.   Neck: No stridor. Cardiovascular: Normal rate, regular rhythm.  No murmurs, rubs, or gallops. Respiratory: Normal respiratory effort without tachypnea nor retractions. Breath sounds are clear and equal bilaterally. No  wheezes/rales/rhonchi. Gastrointestinal: Soft and nontender. No distention.  Genitourinary: Deferred Musculoskeletal: Nontender with normal range of motion in all extremities. No joint effusions.  No lower extremity tenderness nor edema. Neurologic:  Normal speech and language. No gross focal neurologic deficits are appreciated. Skin:  Skin is warm, dry and intact. No rash noted. Psychiatric: Mood and affect are normal. Speech and behavior are normal. Patient exhibits appropriate insight and judgment.  ____________________________________________   EKG  A test 86 bpm. Normal sinus rhythm. Narrow QRS. Normal axis. Normal ST and T-wave. QTC normal 421. No evidence of Wolf Parkinson white or brugada syndrome. ____________________________________________  LABS (pertinent positives/negatives)  Pregnancy test negative Urinalysis trace ketones otherwise negative for signs of acute urinary tract infection Troponin less than 0.03 About panel within normal limits CBC within normal limits  ____________________________________________  RADIOLOGY Radiologist results reviewed  None __________________________________________  PROCEDURES  Procedure(s) performed: None Critical Care performed: None  ____________________________________________   ED COURSE / ASSESSMENT AND PLAN  Pertinent labs & imaging results that were available during my care of the patient were reviewed by me and considered in my medical decision making (see chart for details).   No certain etiology of syncope suspected based by history. Patient is overall well-appearing now. Interestingly she's had a couple of issues of uncertain cause allergic reactions. I do not suspect today's syncope is related to an allergic reaction however I am to go ahead and refer her to see ENT as well as her private care follow-up due to this history of multiple allergic reactions. She has been followed up before for what sound like  vasovagal syncope. I do not suspect a cardiac etiology.  Labs and evaluation are reassuring. Patient did have trace ketones and may have been slightly dehydrated. Patient was given IV fluids in the emergency department. She is okay for outpatient follow-up at this point in time. I discussed return precautions and discharge instructions with the patient and she is comfortable with this plan.  ___________________________________________   FINAL CLINICAL IMPRESSION(S) / ED DIAGNOSES   Final diagnoses:  Syncope, unspecified syncope type      Lisa Roca, MD 09/12/14 1257

## 2014-09-12 NOTE — ED Notes (Signed)
Pt arrived a&o via EMS.  Co-workers report pt was found slumped forward at her desk, came to quickly.  Completely alert, did not have incontinence.  Reports nausea.

## 2014-09-12 NOTE — ED Notes (Signed)
Patient denies pain and is resting comfortably.  

## 2015-07-03 ENCOUNTER — Emergency Department (HOSPITAL_COMMUNITY): Payer: No Typology Code available for payment source

## 2015-07-03 ENCOUNTER — Emergency Department (HOSPITAL_COMMUNITY)
Admission: EM | Admit: 2015-07-03 | Discharge: 2015-07-04 | Disposition: A | Discharge status: Home / Self Care | Payer: No Typology Code available for payment source | Attending: Emergency Medicine | Admitting: Emergency Medicine

## 2015-07-03 ENCOUNTER — Encounter (HOSPITAL_COMMUNITY): Payer: Self-pay | Admitting: Oncology

## 2015-07-03 DIAGNOSIS — Y998 Other external cause status: Secondary | ICD-10-CM | POA: Insufficient documentation

## 2015-07-03 DIAGNOSIS — Y92410 Unspecified street and highway as the place of occurrence of the external cause: Secondary | ICD-10-CM | POA: Insufficient documentation

## 2015-07-03 DIAGNOSIS — Y9389 Activity, other specified: Secondary | ICD-10-CM | POA: Diagnosis not present

## 2015-07-03 DIAGNOSIS — S8991XA Unspecified injury of right lower leg, initial encounter: Secondary | ICD-10-CM | POA: Insufficient documentation
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
## 2015-07-04 ENCOUNTER — Emergency Department (HOSPITAL_COMMUNITY)
Admission: EM | Admit: 2015-07-04 | Discharge: 2015-07-04 | Disposition: A | Discharge status: Home / Self Care | Payer: No Typology Code available for payment source | Attending: Emergency Medicine | Admitting: Emergency Medicine

## 2015-07-04 ENCOUNTER — Encounter (HOSPITAL_COMMUNITY): Payer: Self-pay | Admitting: Emergency Medicine

## 2015-07-04 DIAGNOSIS — Y9389 Activity, other specified: Secondary | ICD-10-CM | POA: Insufficient documentation

## 2015-07-04 DIAGNOSIS — Z859 Personal history of malignant neoplasm, unspecified: Secondary | ICD-10-CM | POA: Diagnosis not present

## 2015-07-04 DIAGNOSIS — Y998 Other external cause status: Secondary | ICD-10-CM | POA: Diagnosis not present

## 2015-07-04 DIAGNOSIS — Z79899 Other long term (current) drug therapy: Secondary | ICD-10-CM | POA: Diagnosis not present

## 2015-07-04 DIAGNOSIS — Y92410 Unspecified street and highway as the place of occurrence of the external cause: Secondary | ICD-10-CM | POA: Insufficient documentation

## 2015-07-04 DIAGNOSIS — S4991XA Unspecified injury of right shoulder and upper arm, initial encounter: Secondary | ICD-10-CM | POA: Diagnosis not present

## 2015-07-04 DIAGNOSIS — S199XXA Unspecified injury of neck, initial encounter: Secondary | ICD-10-CM | POA: Diagnosis present

## 2015-07-04 MED ORDER — IBUPROFEN 600 MG PO TABS: 600.0000 mg | ORAL_TABLET | Freq: Four times a day (QID) | ORAL | Status: DC | PRN
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
## 2015-07-04 NOTE — Discharge Instructions (Signed)

## 2015-07-04 NOTE — ED Notes (Signed)
Patient states she does not wish to bed seen. Patient dispo set to LWBS. Patient was reminded if she changes her mind she can come back at any time.
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
## 2015-09-29 ENCOUNTER — Other Ambulatory Visit: Payer: Self-pay

## 2015-09-29 ENCOUNTER — Emergency Department (HOSPITAL_COMMUNITY)
Admission: EM | Admit: 2015-09-29 | Discharge: 2015-09-29 | Disposition: A | Discharge status: Home / Self Care | Payer: No Typology Code available for payment source | Attending: Dermatology | Admitting: Dermatology

## 2015-09-29 DIAGNOSIS — R55 Syncope and collapse: Secondary | ICD-10-CM | POA: Insufficient documentation

## 2015-09-29 DIAGNOSIS — Z5321 Procedure and treatment not carried out due to patient leaving prior to being seen by health care provider: Secondary | ICD-10-CM | POA: Insufficient documentation

## 2015-09-29 LAB — CBC
HCT: 38.4 % (ref 36.0–46.0)
Hemoglobin: 12.7 g/dL (ref 12.0–15.0)
MCH: 29.4 pg (ref 26.0–34.0)
MCHC: 33.1 g/dL (ref 30.0–36.0)
MCV: 88.9 fL (ref 78.0–100.0)
Platelets: 304 10*3/uL (ref 150–400)
RBC: 4.32 MIL/uL (ref 3.87–5.11)
RDW: 13.3 % (ref 11.5–15.5)
WBC: 5.6 10*3/uL (ref 4.0–10.5)

## 2015-09-29 LAB — BASIC METABOLIC PANEL
Anion gap: 6 (ref 5–15)
BUN: 8 mg/dL (ref 6–20)
CO2: 26 mmol/L (ref 22–32)
Calcium: 9.1 mg/dL (ref 8.9–10.3)
Chloride: 106 mmol/L (ref 101–111)
Creatinine, Ser: 0.77 mg/dL (ref 0.44–1.00)
GFR calc Af Amer: >60 mL/min (ref >60)
GFR calc non Af Amer: >60 mL/min (ref >60)
Glucose, Bld: 93 mg/dL (ref 65–99)
Potassium: 3.5 mmol/L (ref 3.5–5.1)
Sodium: 138 mmol/L (ref 135–145)

## 2015-09-29 LAB — I-STAT TROPONIN, ED: Troponin i, poc: 0 ng/mL (ref 0.00–0.08)

## 2015-09-29 NOTE — ED Notes (Signed)
Per EMS. Pt from work. Had a syncopal episode that lasted 30 seconds. Was assisted to couch by coworker. A+O upon ems arrival. No complaints with EMS

## 2015-09-29 NOTE — ED Notes (Signed)
Patient called 3 times in lobby.  Did not respond.

## 2015-09-29 NOTE — ED Notes (Signed)
Patient presents to ED following syncopal episode at work.  Patient states this has happened several times in past and she was evaluated by neurology.  No cause found.  Patient denies chest pain and SOB, but c/o nausea and headache.  Patient appears to be in no distress.

## 2016-06-28 ENCOUNTER — Encounter (HOSPITAL_COMMUNITY): Payer: Self-pay | Admitting: Adult Health

## 2016-06-28 ENCOUNTER — Emergency Department (HOSPITAL_COMMUNITY)
Admission: EM | Admit: 2016-06-28 | Discharge: 2016-06-28 | Disposition: A | Discharge status: Home / Self Care | Payer: Medicaid Other | Attending: Emergency Medicine | Admitting: Emergency Medicine

## 2016-06-28 ENCOUNTER — Emergency Department (HOSPITAL_COMMUNITY): Payer: Medicaid Other

## 2016-06-28 DIAGNOSIS — R55 Syncope and collapse: Secondary | ICD-10-CM | POA: Insufficient documentation

## 2016-06-28 DIAGNOSIS — Z859 Personal history of malignant neoplasm, unspecified: Secondary | ICD-10-CM | POA: Insufficient documentation

## 2016-06-28 LAB — URINALYSIS, ROUTINE W REFLEX MICROSCOPIC
Bilirubin Urine: NEGATIVE
Glucose, UA: NEGATIVE mg/dL
HGB URINE DIPSTICK: NEGATIVE
KETONES UR: 5 mg/dL — AB
LEUKOCYTES UA: NEGATIVE
Nitrite: NEGATIVE
PROTEIN: NEGATIVE mg/dL
Specific Gravity, Urine: 1.003 — ABNORMAL LOW (ref 1.005–1.030)
pH: 7 (ref 5.0–8.0)

## 2016-06-28 LAB — I-STAT CHEM 8, ED
BUN: 5 mg/dL — ABNORMAL LOW (ref 6–20)
Calcium, Ion: 1.16 mmol/L (ref 1.15–1.40)
Chloride: 103 mmol/L (ref 101–111)
Creatinine, Ser: 0.7 mg/dL (ref 0.44–1.00)
GLUCOSE: 83 mg/dL (ref 65–99)
HEMATOCRIT: 34 % — AB (ref 36.0–46.0)
HEMOGLOBIN: 11.6 g/dL — AB (ref 12.0–15.0)
POTASSIUM: 3.4 mmol/L — AB (ref 3.5–5.1)
Sodium: 139 mmol/L (ref 135–145)
TCO2: 25 mmol/L (ref 0–100)

## 2016-06-28 LAB — CBC WITH DIFFERENTIAL/PLATELET
BASOS ABS: 0.1 10*3/uL (ref 0.0–0.1)
BASOS PCT: 1 %
EOS PCT: 1 %
Eosinophils Absolute: 0.1 10*3/uL (ref 0.0–0.7)
HCT: 33.4 % — ABNORMAL LOW (ref 36.0–46.0)
Hemoglobin: 10.6 g/dL — ABNORMAL LOW (ref 12.0–15.0)
Lymphocytes Relative: 33 %
Lymphs Abs: 2.2 10*3/uL (ref 0.7–4.0)
MCH: 27.2 pg (ref 26.0–34.0)
MCHC: 31.7 g/dL (ref 30.0–36.0)
MCV: 85.9 fL (ref 78.0–100.0)
MONO ABS: 0.3 10*3/uL (ref 0.1–1.0)
Monocytes Relative: 4 %
Neutro Abs: 4.1 10*3/uL (ref 1.7–7.7)
Neutrophils Relative %: 61 %
PLATELETS: 338 10*3/uL (ref 150–400)
RBC: 3.89 MIL/uL (ref 3.87–5.11)
RDW: 14.3 % (ref 11.5–15.5)
WBC: 6.7 10*3/uL (ref 4.0–10.5)

## 2016-06-28 LAB — I-STAT TROPONIN, ED: TROPONIN I, POC: 0 ng/mL (ref 0.00–0.08)

## 2016-06-28 LAB — CBG MONITORING, ED: GLUCOSE-CAPILLARY: 80 mg/dL (ref 65–99)

## 2016-06-28 NOTE — Discharge Instructions (Signed)
I would recommend that you eat on a regular basis to keep your blood sugar stable  Make an appointment with your PCP and neurologist for further evaluation

## 2016-06-28 NOTE — ED Provider Notes (Signed)
[facility] DEPT Provider Note   CSN: [id number] Arrival date & time: 06/28/16  1941     History   Chief Complaint Chief Complaint  Patient presents with  . Seizures    HPI Marcia Hanson is a 36 y.o. female.  This a normally healthy 36 year old female with a history of syncopal episodes who presents today via EMS after being called to her workplace where she had a syncopal episode. Patient states that she was driving to work and has no recollection of a rising, walking into the facility where the episode, but her car was parked appropriately.  Her keys were in her pocketbook and the remainder of her belongings were in her possession. He states she has not eaten all day, but had 7 limit cookies annoying work. Denies any recent illnesses, denies chest pain, shortness of breath, abdominal pain, dysuria, but states that she's had a headache all day, which is unusual for her.      Past Medical History:  Diagnosis Date  . Cancer ([place])   . Seizures ([place])   . Syncope     Patient Active Problem List   Diagnosis Date Noted  . Syncope     History reviewed. No pertinent surgical history.  OB History    No data available       Home Medications    Prior to Admission medications   Medication Sig Start Date End Date Taking? Authorizing Provider  ibuprofen (ADVIL,MOTRIN) 600 MG tablet Take 1 tablet (600 mg total) by mouth every 6 (six) hours as needed. Patient not taking: Reported on 06/28/2016 07/04/15   Leo Grosser, MD  predniSONE (STERAPRED UNI-PAK 21 TAB) 10 MG (21) TBPK tablet As directed Patient not taking: Reported on 06/28/2016 08/24/14   Johnn Hai, PA-C  ranitidine (ZANTAC) 150 MG tablet Take 1 tablet (150 mg total) by mouth 2 (two) times daily. Patient not taking: Reported on 06/28/2016 08/24/14 08/24/15  Johnn Hai, PA-C    Family History History reviewed. No pertinent family history.  Social History Social History  Substance Use Topics  .  Smoking status: Never Smoker  . Smokeless tobacco: Not on file  . Alcohol use No     Allergies   Avocado   Review of Systems Review of Systems  Constitutional: Negative for fever.  HENT: Negative for congestion and sinus pressure.   Respiratory: Negative for cough.   Cardiovascular: Negative for chest pain.  Gastrointestinal: Negative for abdominal pain.  Genitourinary: Negative for dysuria.  Musculoskeletal: Negative for arthralgias, back pain and myalgias.  Neurological: Positive for syncope and headaches. Negative for dizziness and weakness.  All other systems reviewed and are negative.    Physical Exam Updated Vital Signs BP (!) 143/82 (BP Location: Right Arm)   Pulse 68   Temp 98.3 F (36.8 C) (Oral)   Resp 16   LMP 06/22/2016 (Exact Date)   SpO2 100%   Physical Exam  Constitutional: She is oriented to person, place, and time. She appears well-developed and well-nourished.  HENT:  Right Ear: External ear normal.  Left Ear: External ear normal.  Eyes: Pupils are equal, round, and reactive to light.  Neck: Normal range of motion.  Cardiovascular: Normal rate.   Pulmonary/Chest: Effort normal.  Abdominal: Soft.  Neurological: She is alert and oriented to person, place, and time.  Skin: Skin is warm and dry.  Psychiatric: She has a normal mood and affect.  Nursing note and vitals reviewed.    ED Treatments / Results  Labs (all labs ordered are listed, but only abnormal results are displayed) Labs Reviewed  CBC WITH DIFFERENTIAL/PLATELET - Abnormal; Notable for the following:       Result Value   Hemoglobin 10.6 (*)    HCT 33.4 (*)    All other components within normal limits  URINALYSIS, ROUTINE W REFLEX MICROSCOPIC - Abnormal; Notable for the following:    Color, Urine COLORLESS (*)    Specific Gravity, Urine 1.003 (*)    Ketones, ur 5 (*)    All other components within normal limits  I-STAT CHEM 8, ED - Abnormal; Notable for the following:     Potassium 3.4 (*)    BUN 5 (*)    Hemoglobin 11.6 (*)    HCT 34.0 (*)    All other components within normal limits  CBG MONITORING, ED  I-STAT TROPOININ, ED    EKG  EKG Interpretation  Date/Time:  Wednesday June 28 2016 20:31:02 EDT Ventricular Rate:  68 PR Interval:    QRS Duration: 87 QT Interval:  400 QTC Calculation: 426 R Axis:   83 Text Interpretation:  Sinus rhythm st depression in lead III is new otherwise similar to prior ECG Confirmed by KNAPP  MD-J, JON ([id number]) on 06/28/2016 8:41:36 PM       Radiology Ct Head Wo Contrast  Result Date: 06/28/2016 CLINICAL DATA:  Syncope today EXAM: CT HEAD WITHOUT CONTRAST TECHNIQUE: Contiguous axial images were obtained from the base of the skull through the vertex without intravenous contrast. COMPARISON:  None. FINDINGS: Brain: No intracranial hemorrhage, mass effect or midline shift. No acute cortical infarction. No mass lesion is noted on this unenhanced scan. No hydrocephalus. The gray and white-matter differentiation is preserved. Vascular: No hyperdense vessel or unexpected calcification. Skull: No skull fracture is noted. Sinuses/Orbits: There is mucosal thickening with partial opacification left sphenoid sinus. Mucosal thickening with almost complete opacification bilateral ethmoid air cells. Mild mucosal thickening left frontal sinus. Mild mucosal thickening left maxillary sinus. Other: None IMPRESSION: No acute intracranial abnormality. Paranasal sinuses disease as described above. Electronically Signed   By: Lahoma Crocker M.D.   On: 06/28/2016 21:59    Procedures Procedures (including critical care time)  Medications Ordered in ED Medications - No data to display   Initial Impression / Assessment and Plan / ED Course  I have reviewed the triage vital signs and the nursing notes.  Pertinent labs & imaging results that were available during my care of the patient were reviewed by me and considered in my medical decision  making (see chart for details).    Results and x-ray/CT scan results discussed with patient.  I recommend that she eat on a regular basis to keep her blood sugar is stable level as she may have been hypo-glycemic causing her syncopal event.  Also recommended that she follow-up with her PCP as well as neurology for further evaluation.  Since she does have a history of syncope   Final Clinical Impressions(s) / ED Diagnoses   Final diagnoses:  Syncope, unspecified syncope type    New Prescriptions New Prescriptions   No medications on file     Junius Creamer, NP 06/28/16 2033    Junius Creamer, NP 06/28/16 6578    Dorie Rank, MD 06/28/16 2340
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
## 2016-09-12 ENCOUNTER — Encounter (HOSPITAL_COMMUNITY): Payer: Self-pay | Admitting: *Deleted

## 2016-09-12 ENCOUNTER — Emergency Department (HOSPITAL_COMMUNITY): Payer: Medicaid Other

## 2016-09-12 ENCOUNTER — Emergency Department (HOSPITAL_COMMUNITY)
Admission: EM | Admit: 2016-09-12 | Discharge: 2016-09-12 | Disposition: A | Discharge status: Home / Self Care | Payer: Medicaid Other | Attending: Emergency Medicine | Admitting: Emergency Medicine

## 2016-09-12 DIAGNOSIS — R102 Pelvic and perineal pain: Secondary | ICD-10-CM | POA: Diagnosis not present

## 2016-09-12 DIAGNOSIS — Z79899 Other long term (current) drug therapy: Secondary | ICD-10-CM | POA: Diagnosis not present

## 2016-09-12 DIAGNOSIS — Z859 Personal history of malignant neoplasm, unspecified: Secondary | ICD-10-CM | POA: Diagnosis not present

## 2016-09-12 DIAGNOSIS — O209 Hemorrhage in early pregnancy, unspecified: Secondary | ICD-10-CM | POA: Diagnosis not present

## 2016-09-12 DIAGNOSIS — Z3A09 9 weeks gestation of pregnancy: Secondary | ICD-10-CM | POA: Diagnosis not present

## 2016-09-12 DIAGNOSIS — O4690 Antepartum hemorrhage, unspecified, unspecified trimester: Secondary | ICD-10-CM

## 2016-09-12 DIAGNOSIS — N939 Abnormal uterine and vaginal bleeding, unspecified: Secondary | ICD-10-CM

## 2016-09-12 LAB — POC URINE PREG, ED: Preg Test, Ur: POSITIVE — AB

## 2016-09-12 LAB — COMPREHENSIVE METABOLIC PANEL
ALBUMIN: 3.2 g/dL — AB (ref 3.5–5.0)
ALK PHOS: 36 U/L — AB (ref 38–126)
ALT: 13 U/L — ABNORMAL LOW (ref 14–54)
ANION GAP: 7 (ref 5–15)
AST: 16 U/L (ref 15–41)
BILIRUBIN TOTAL: 0.5 mg/dL (ref 0.3–1.2)
BUN: 8 mg/dL (ref 6–20)
CALCIUM: 9 mg/dL (ref 8.9–10.3)
CO2: 22 mmol/L (ref 22–32)
CREATININE: 0.56 mg/dL (ref 0.44–1.00)
Chloride: 106 mmol/L (ref 101–111)
GFR calc Af Amer: >60 mL/min (ref >60)
GFR calc non Af Amer: >60 mL/min (ref >60)
GLUCOSE: 89 mg/dL (ref 65–99)
Potassium: 3.9 mmol/L (ref 3.5–5.1)
Sodium: 135 mmol/L (ref 135–145)
TOTAL PROTEIN: 7 g/dL (ref 6.5–8.1)

## 2016-09-12 LAB — CBC WITH DIFFERENTIAL/PLATELET
BASOS PCT: 0 %
Basophils Absolute: 0 10*3/uL (ref 0.0–0.1)
Eosinophils Absolute: 0.2 10*3/uL (ref 0.0–0.7)
Eosinophils Relative: 3 %
HEMATOCRIT: 32.5 % — AB (ref 36.0–46.0)
HEMOGLOBIN: 10.5 g/dL — AB (ref 12.0–15.0)
LYMPHS ABS: 2 10*3/uL (ref 0.7–4.0)
Lymphocytes Relative: 29 %
MCH: 26.6 pg (ref 26.0–34.0)
MCHC: 32.3 g/dL (ref 30.0–36.0)
MCV: 82.3 fL (ref 78.0–100.0)
Monocytes Absolute: 0.5 10*3/uL (ref 0.1–1.0)
Monocytes Relative: 8 %
NEUTROS ABS: 4.2 10*3/uL (ref 1.7–7.7)
NEUTROS PCT: 60 %
Platelets: 335 10*3/uL (ref 150–400)
RBC: 3.95 MIL/uL (ref 3.87–5.11)
RDW: 14.2 % (ref 11.5–15.5)
WBC: 6.8 10*3/uL (ref 4.0–10.5)

## 2016-09-12 LAB — WET PREP, GENITAL
Clue Cells Wet Prep HPF POC: NONE SEEN
Sperm: NONE SEEN
TRICH WET PREP: NONE SEEN
Yeast Wet Prep HPF POC: NONE SEEN

## 2016-09-12 LAB — URINALYSIS, ROUTINE W REFLEX MICROSCOPIC
Bilirubin Urine: NEGATIVE
GLUCOSE, UA: NEGATIVE mg/dL
Hgb urine dipstick: NEGATIVE
Ketones, ur: NEGATIVE mg/dL
LEUKOCYTES UA: NEGATIVE
Nitrite: NEGATIVE
PH: 7 (ref 5.0–8.0)
Protein, ur: NEGATIVE mg/dL
SPECIFIC GRAVITY, URINE: 1.025 (ref 1.005–1.030)

## 2016-09-12 LAB — HCG, QUANTITATIVE, PREGNANCY: hCG, Beta Chain, Quant, S: 202312 m[IU]/mL — ABNORMAL HIGH (ref <5)

## 2016-09-12 MED ORDER — FERROUS SULFATE 325 (65 FE) MG PO TABS: 325.0000 mg | ORAL_TABLET | Freq: Every day | ORAL | 0 refills | Status: DC

## 2016-09-12 NOTE — ED Notes (Signed)
Pt A&OX4, ambulatory at d/c with independent steady gait, NAD

## 2016-09-12 NOTE — ED Notes (Signed)
Pt getting dressed at this time.

## 2016-09-12 NOTE — Discharge Instructions (Signed)
Take your iron supplements as prescribed. Continue taking her prenatal vitamins as prescribed. I recommend following up with your OB/GYN at her scheduled appointment on Friday. Please return to the Emergency Department if symptoms worsen or new onset of fever, chest pain, difficulty breathing, abdominal pain, vomiting, worsening vaginal bleeding, syncope, seizure.

## 2016-09-12 NOTE — ED Triage Notes (Signed)
Pt reports that she noticed vaginal bleeding today with back pain and abdominal cramping. Pt states that it was bright red with no clots. Pt states that this only happened once. Pt states that her first prenatal appointment is Friday.

## 2016-09-12 NOTE — ED Provider Notes (Signed)
[facility] DEPT Provider Note   CSN: [id number] Arrival date & time: 09/12/16  1321     History   Chief Complaint Chief Complaint  Patient presents with  . Vaginal Bleeding    HPI Marcia Hanson is a 36 y.o. female.  HPI   Patient is a 36 year old female with no reported past medical history who presents the ED with complaint of vaginal bleeding and pelvic cramping. Patient notes she is [redacted] weeks pregnant. Patient reports around noon today she began having constant lower abdominal/pelvic cramping. She notes that she went to urinate she noticed she was having a small amount of bright red vaginal bleeding. She reports having only one episode and denies passage of blood clots or tissue products. She notes she has continued to have cramping which radiates around to her lower back and feels similar to menstrual cramps she has had in the past. Denies taking any medications at home for her symptoms. Denies fever, chills, headache, lightheadedness, dizziness, chest pain, shortness of breath, nausea, vomiting, diarrhea, urinary symptoms, vaginal discharge. Patient notes she has taken a home pregnancy test which was positive and also states her pregnancy was confirmed at the health Department. LMP 07/11/16. G2P0A1. Patient notes she has an appointment scheduled with OB in 3 days for her initial evaluation.  Past Medical History:  Diagnosis Date  . Cancer ([place])   . Seizures ([place])   . Syncope     Patient Active Problem List   Diagnosis Date Noted  . Syncope     History reviewed. No pertinent surgical history.  OB History    No data available       Home Medications    Prior to Admission medications   Medication Sig Start Date End Date Taking? Authorizing Provider  Menthol, Topical Analgesic, (MENTHOL EX) Apply 1 patch topically as needed (pain).   Yes [provider]  Prenatal Vit-Fe Fumarate-FA (PRENATAL MULTIVITAMIN) TABS tablet Take 1 tablet by mouth daily at 12  noon.   Yes [provider]  ferrous sulfate 325 (65 FE) MG tablet Take 1 tablet (325 mg total) by mouth daily. 09/12/16   Nona Dell, PA-C  ibuprofen (ADVIL,MOTRIN) 600 MG tablet Take 1 tablet (600 mg total) by mouth every 6 (six) hours as needed. Patient not taking: Reported on 06/28/2016 07/04/15   Leo Grosser, MD  predniSONE (STERAPRED UNI-PAK 21 TAB) 10 MG (21) TBPK tablet As directed Patient not taking: Reported on 06/28/2016 08/24/14   Johnn Hai, PA-C  ranitidine (ZANTAC) 150 MG tablet Take 1 tablet (150 mg total) by mouth 2 (two) times daily. Patient not taking: Reported on 06/28/2016 08/24/14 08/24/15  Johnn Hai, PA-C    Family History No family history on file.  Social History Social History  Substance Use Topics  . Smoking status: Never Smoker  . Smokeless tobacco: Not on file  . Alcohol use No     Allergies   Avocado   Review of Systems Review of Systems  Gastrointestinal: Positive for abdominal pain (cramping).  Genitourinary: Positive for pelvic pain (cramping) and vaginal bleeding.  All other systems reviewed and are negative.    Physical Exam Updated Vital Signs BP (!) 133/95   Pulse 73   Temp 97.8 F (36.6 C) (Oral)   Resp 16   LMP 07/11/2016   SpO2 99%   Physical Exam  Constitutional: She is oriented to person, place, and time. She appears well-developed and well-nourished. No distress.  HENT:  Head: Normocephalic and atraumatic.  Mouth/Throat: Uvula is midline, oropharynx is clear and moist and mucous membranes are normal. No oropharyngeal exudate, posterior oropharyngeal edema, posterior oropharyngeal erythema or tonsillar abscesses. No tonsillar exudate.  Eyes: Conjunctivae and EOM are normal. Right eye exhibits no discharge. Left eye exhibits no discharge. No scleral icterus.  Neck: Normal range of motion. Neck supple.  Cardiovascular: Normal rate, regular rhythm, normal heart sounds and intact distal pulses.     Pulmonary/Chest: Effort normal and breath sounds normal. No respiratory distress. She has no wheezes. She has no rales. She exhibits no tenderness.  Abdominal: Soft. Bowel sounds are normal. She exhibits no distension and no mass. There is no tenderness. There is no rebound and no guarding. No hernia.  Musculoskeletal: Normal range of motion. She exhibits no edema.  Neurological: She is alert and oriented to person, place, and time.  Skin: Skin is warm and dry. She is not diaphoretic.  Nursing note and vitals reviewed.  Pelvic exam: normal external genitalia, vulva, vagina, cervix, uterus and adnexa, VULVA: normal appearing vulva with no masses, tenderness or lesions, VAGINA: normal appearing vagina with normal color, no lesions, vaginal discharge - white, curd-like and scant, CERVIX: normal appearing cervix without discharge or lesions, WET MOUNT done - results: white blood cells, DNA probe for chlamydia and GC obtained, UTERUS: uterus is normal size, shape, consistency and nontender, ADNEXA: normal adnexa in size, nontender and no masses, exam chaperoned by female tech.   ED Treatments / Results  Labs (all labs ordered are listed, but only abnormal results are displayed) Labs Reviewed  WET PREP, GENITAL - Abnormal; Notable for the following:       Result Value   WBC, Wet Prep HPF POC MODERATE (*)    All other components within normal limits  URINALYSIS, ROUTINE W REFLEX MICROSCOPIC - Abnormal; Notable for the following:    APPearance HAZY (*)    All other components within normal limits  CBC WITH DIFFERENTIAL/PLATELET - Abnormal; Notable for the following:    Hemoglobin 10.5 (*)    HCT 32.5 (*)    All other components within normal limits  COMPREHENSIVE METABOLIC PANEL - Abnormal; Notable for the following:    Albumin 3.2 (*)    ALT 13 (*)    Alkaline Phosphatase 36 (*)    All other components within normal limits  HCG, QUANTITATIVE, PREGNANCY - Abnormal; Notable for the following:     hCG, Beta Chain, Quant, S 202,312 (*)    All other components within normal limits  POC URINE PREG, ED - Abnormal; Notable for the following:    Preg Test, Ur POSITIVE (*)    All other components within normal limits  GC/CHLAMYDIA PROBE AMP ([place]) NOT AT [facility]    EKG  EKG Interpretation None       Radiology US Ob Comp Less 14 Wks  Result Date: 09/12/2016 CLINICAL DATA:  Vaginal bleeding. Quantitative beta HCG [id number]. Estimated gestational age per LMP 9 weeks 0 days. EXAM: OBSTETRIC <14 WK [place] AND TRANSVAGINAL OB US TECHNIQUE: Both transabdominal and transvaginal ultrasound examinations were performed for complete evaluation of the gestation as well as the maternal uterus, adnexal regions, and pelvic cul-de-sac. Transvaginal technique was performed to assess early pregnancy. COMPARISON:  None. FINDINGS: Intrauterine gestational sac: Single visualized. Yolk sac:  Visualized. Embryo:  Visualized. Cardiac Activity: Visualized. Heart Rate: 110  bpm CRL:  30.5  mm   9 w   6 d  [place] EDC: 04/11/2017 Subchorionic hemorrhage:  None visualized. Maternal uterus/adnexae: Ovaries are normal in size, shape and position with normal color flow. No free pelvic fluid. IMPRESSION: Single live IUP with estimated gestational age [redacted] weeks 6 days. Electronically Signed   By: Marin Olp M.D.   On: 09/12/2016 18:49   US Ob Transvaginal  Result Date: 09/12/2016 CLINICAL DATA:  Vaginal bleeding. Quantitative beta HCG [id number]. Estimated gestational age per LMP 9 weeks 0 days. EXAM: OBSTETRIC <14 WK [place] AND TRANSVAGINAL OB US TECHNIQUE: Both transabdominal and transvaginal ultrasound examinations were performed for complete evaluation of the gestation as well as the maternal uterus, adnexal regions, and pelvic cul-de-sac. Transvaginal technique was performed to assess early pregnancy. COMPARISON:  None. FINDINGS: Intrauterine gestational sac: Single visualized. Yolk sac:  Visualized. Embryo:   Visualized. Cardiac Activity: Visualized. Heart Rate: 110  bpm CRL:  30.5  mm   9 w   6 d                  [place] EDC: 04/11/2017 Subchorionic hemorrhage:  None visualized. Maternal uterus/adnexae: Ovaries are normal in size, shape and position with normal color flow. No free pelvic fluid. IMPRESSION: Single live IUP with estimated gestational age [redacted] weeks 6 days. Electronically Signed   By: Marin Olp M.D.   On: 09/12/2016 18:49    Procedures Procedures (including critical care time)  Medications Ordered in ED Medications - No data to display   Initial Impression / Assessment and Plan / ED Course  I have reviewed the triage vital signs and the nursing notes.  Pertinent labs & imaging results that were available during my care of the patient were reviewed by me and considered in my medical decision making (see chart for details).     Patient presents with single episode of vaginal bleeding and abdominal cramping. Reports she is approximately [redacted] weeks pregnant. Denies fever, vomiting. VSS. Exam unremarkable. Pelvic exam revealed scant amount of white curd-like discharge, closed cervical os, no CMT or adnexal tenderness. Patient declined pain meds in the ED. Beta hCG 202,312. Wet prep positive for moderate WBCs. Hemoglobin 10.5. Remaining labs unremarkable. Pelvic ultrasound revealed single live IUP with estimated gestational age [redacted] weeks 6 days, no subchorionic hemorrhage visualized. On reevaluation patient is sitting resting comfortably in bed and denies any pain or complaints. Discussed results and plan for discharge with patient. Patient has remained hemodynamically stable while in the ED. Plan to have patient follow up with her OB/GYN at her scheduled appointment in 3 days. Discussed return precautions.  Final Clinical Impressions(s) / ED Diagnoses   Final diagnoses:  Vaginal bleeding in pregnancy    New Prescriptions Discharge Medication List as of 09/12/2016  7:04 PM    START taking these  medications   Details  ferrous sulfate 325 (65 FE) MG tablet Take 1 tablet (325 mg total) by mouth daily., Starting Tue 09/12/2016, Print         Nona Dell, PA-C 09/12/16 [place], MD 09/14/16 [phone]

## 2016-09-12 NOTE — ED Notes (Signed)
Pt giving urine sample at this time.

## 2016-09-14 LAB — GC/CHLAMYDIA PROBE AMP (~~LOC~~) NOT AT ARMC
CHLAMYDIA, DNA PROBE: NEGATIVE
NEISSERIA GONORRHEA: NEGATIVE

## 2017-01-19 ENCOUNTER — Encounter (HOSPITAL_COMMUNITY): Payer: Self-pay

## 2017-01-19 ENCOUNTER — Emergency Department (HOSPITAL_COMMUNITY): Payer: Medicaid Other

## 2017-01-19 ENCOUNTER — Emergency Department (HOSPITAL_COMMUNITY)
Admission: EM | Admit: 2017-01-19 | Discharge: 2017-01-19 | Disposition: A | Discharge status: Home / Self Care | Payer: Medicaid Other | Attending: Emergency Medicine | Admitting: Emergency Medicine

## 2017-01-19 DIAGNOSIS — R55 Syncope and collapse: Secondary | ICD-10-CM | POA: Diagnosis present

## 2017-01-19 DIAGNOSIS — J111 Influenza due to unidentified influenza virus with other respiratory manifestations: Secondary | ICD-10-CM | POA: Insufficient documentation

## 2017-01-19 DIAGNOSIS — Z8541 Personal history of malignant neoplasm of cervix uteri: Secondary | ICD-10-CM | POA: Diagnosis not present

## 2017-01-19 DIAGNOSIS — R054 Cough syncope: Secondary | ICD-10-CM

## 2017-01-19 DIAGNOSIS — R69 Illness, unspecified: Secondary | ICD-10-CM

## 2017-01-19 DIAGNOSIS — R05 Cough: Secondary | ICD-10-CM

## 2017-01-19 NOTE — Discharge Instructions (Signed)
Make sure that you drink at least six 8 ounce glasses of water or Gatorade each day in order to stay well-hydrated.  Keep your scheduled appointment with your new primary care physician later this month. Take Tylenol as directed every 4 hours for aches or for temperature higher than 100.4 while awake. Return if concerned or if you feel worse for any reason

## 2017-01-19 NOTE — ED Notes (Signed)
Patient unable to sign for discharge at this time; pt verbalizes understanding of discharge instructions. Opportunity for questioning and answers were provided.

## 2017-01-19 NOTE — ED Triage Notes (Addendum)
Pt reports having a coughing fit around 11 AM and then passed out, pt reports having a cough since last night. Denies any dizziness, shortness of breath or chest pain now or prior to passing out. Denies any injuries from the fall. Patient is alert and oriented. Complaining of a frontal headache that is dull in nature and a cough. Reports having a fever of 102 at home and taking tylenol at 10, afebrile on arrival to ed.

## 2017-01-19 NOTE — ED Notes (Signed)
ED Provider at bedside. 

## 2017-01-19 NOTE — ED Provider Notes (Signed)
[facility] DEPT Provider Note   CSN: [id number] Arrival date & time: 01/19/17  1206     History   Chief Complaint Chief Complaint  Patient presents with  . Near Syncope    HPI Marcia Hanson is a 36 y.o. female.  HPI Patient with cough nonproductive since yesterday.Denies shortness of breath when not coughing. She had temperature of 102.7 this morning. While having a coughing spell this morning she had a syncopal event. Other associated symptoms include mild headache and mild sore throat which is worse with coughing and with swallowing. No other associated symptoms.treated with Tylenol this morning Past Medical History:  Diagnosis Date  . Cancer ([place])   . Seizures ([place])   . Syncope   cervical cancer  Patient Active Problem List   Diagnosis Date Noted  . Syncope     History reviewed. No pertinent surgical history.  OB History    No data available       Home Medications    Prior to Admission medications   Medication Sig Start Date End Date Taking? Authorizing Provider  ferrous sulfate 325 (65 FE) MG tablet Take 1 tablet (325 mg total) by mouth daily. 09/12/16   Nona Dell, PA-C  ibuprofen (ADVIL,MOTRIN) 600 MG tablet Take 1 tablet (600 mg total) by mouth every 6 (six) hours as needed. Patient not taking: Reported on 06/28/2016 07/04/15   Leo Grosser, MD  Menthol, Topical Analgesic, (MENTHOL EX) Apply 1 patch topically as needed (pain).    [provider]  predniSONE (STERAPRED UNI-PAK 21 TAB) 10 MG (21) TBPK tablet As directed Patient not taking: Reported on 06/28/2016 08/24/14   Johnn Hai, PA-C  Prenatal Vit-Fe Fumarate-FA (PRENATAL MULTIVITAMIN) TABS tablet Take 1 tablet by mouth daily at 12 noon.    [provider]  ranitidine (ZANTAC) 150 MG tablet Take 1 tablet (150 mg total) by mouth 2 (two) times daily. Patient not taking: Reported on 06/28/2016 08/24/14 08/24/15  Johnn Hai, PA-C    Family History No  family history on file.  Social History Social History  Substance Use Topics  . Smoking status: Never Smoker  . Smokeless tobacco: Never Used  . Alcohol use No     Allergies   Avocado   Review of Systems Review of Systems  Constitutional: Positive for fever.  HENT: Positive for sore throat.   Respiratory: Positive for cough.   Cardiovascular: Negative.   Gastrointestinal: Negative.   Musculoskeletal: Negative.   Skin: Negative.   Neurological: Positive for headaches.  Psychiatric/Behavioral: Negative.      Physical Exam Updated Vital Signs BP (!) 136/97   Pulse 86   Temp 98 F (36.7 C) (Oral)   Resp 18   Ht 5\' 7"  (1.702 m)   Wt 59.9 kg (132 lb)   LMP 12/28/2016   SpO2 100%   BMI 20.67 kg/m   Physical Exam  Constitutional: She appears well-developed and well-nourished. No distress.  HENT:  Head: Normocephalic and atraumatic.  Mouth/Throat: No oropharyngeal exudate.  Oropharynx mildly reddened. No tonsillar exudate or swelling uvula midline  Eyes: Pupils are equal, round, and reactive to light. Conjunctivae are normal.  Neck: Neck supple. No tracheal deviation present. No thyromegaly present.  Cardiovascular: Normal rate and regular rhythm.   No murmur heard. Pulmonary/Chest: Effort normal and breath sounds normal.  Coughing occasionally  Abdominal: Soft. Bowel sounds are normal. She exhibits no distension. There is no tenderness.  Musculoskeletal: Normal range of motion. She exhibits no edema or tenderness.  Neurological: She is alert. Coordination normal.  Skin: Skin is warm and dry. No rash noted.  Psychiatric: She has a normal mood and affect.  Nursing note and vitals reviewed.    ED Treatments / Results  Labs (all labs ordered are listed, but only abnormal results are displayed) Labs Reviewed - No data to display  EKG  EKG Interpretation  Date/Time:  Friday January 19 2017 12:14:25 EDT Ventricular Rate:  100 PR Interval:    QRS  Duration: 82 QT Interval:  333 QTC Calculation: 430 R Axis:   45 Text Interpretation:  Sinus tachycardia with irregular rate ST elev, probable normal early repol pattern No significant change since last tracing Confirmed by Orlie Dakin [phone]) on 01/19/2017 12:41:46 PM       Radiology No results found.  Procedures Procedures (including critical care time)  Medications Ordered in ED Medications - No data to displaychest x-ray viewed by me Results for orders placed or performed during the hospital encounter of 09/12/16  Wet prep, genital  Result Value Ref Range   Yeast Wet Prep HPF POC NONE SEEN NONE SEEN   Trich, Wet Prep NONE SEEN NONE SEEN   Clue Cells Wet Prep HPF POC NONE SEEN NONE SEEN   WBC, Wet Prep HPF POC MODERATE (A) NONE SEEN   Sperm NONE SEEN   Urinalysis, Routine w reflex microscopic  Result Value Ref Range   Color, Urine YELLOW YELLOW   APPearance HAZY (A) CLEAR   Specific Gravity, Urine 1.025 1.005 - 1.030   pH 7.0 5.0 - 8.0   Glucose, UA NEGATIVE NEGATIVE mg/dL   Hgb urine dipstick NEGATIVE NEGATIVE   Bilirubin Urine NEGATIVE NEGATIVE   Ketones, ur NEGATIVE NEGATIVE mg/dL   Protein, ur NEGATIVE NEGATIVE mg/dL   Nitrite NEGATIVE NEGATIVE   Leukocytes, UA NEGATIVE NEGATIVE  CBC with Differential  Result Value Ref Range   WBC 6.8 4.0 - 10.5 K/uL   RBC 3.95 3.87 - 5.11 MIL/uL   Hemoglobin 10.5 (L) 12.0 - 15.0 g/dL   HCT 32.5 (L) 36.0 - 46.0 %   MCV 82.3 78.0 - 100.0 fL   MCH 26.6 26.0 - 34.0 pg   MCHC 32.3 30.0 - 36.0 g/dL   RDW 14.2 11.5 - 15.5 %   Platelets 335 150 - 400 K/uL   Neutrophils Relative % 60 %   Neutro Abs 4.2 1.7 - 7.7 K/uL   Lymphocytes Relative 29 %   Lymphs Abs 2.0 0.7 - 4.0 K/uL   Monocytes Relative 8 %   Monocytes Absolute 0.5 0.1 - 1.0 K/uL   Eosinophils Relative 3 %   Eosinophils Absolute 0.2 0.0 - 0.7 K/uL   Basophils Relative 0 %   Basophils Absolute 0.0 0.0 - 0.1 K/uL  Comprehensive metabolic panel  Result Value Ref  Range   Sodium 135 135 - 145 mmol/L   Potassium 3.9 3.5 - 5.1 mmol/L   Chloride 106 101 - 111 mmol/L   CO2 22 22 - 32 mmol/L   Glucose, Bld 89 65 - 99 mg/dL   BUN 8 6 - 20 mg/dL   Creatinine, Ser 0.56 0.44 - 1.00 mg/dL   Calcium 9.0 8.9 - 10.3 mg/dL   Total Protein 7.0 6.5 - 8.1 g/dL   Albumin 3.2 (L) 3.5 - 5.0 g/dL   AST 16 15 - 41 U/L   ALT 13 (L) 14 - 54 U/L   Alkaline Phosphatase 36 (L) 38 - 126 U/L   Total Bilirubin 0.5 0.3 -  1.2 mg/dL   GFR calc non Af Amer >60 >60 mL/min   GFR calc Af Amer >60 >60 mL/min   Anion gap 7 5 - 15  hCG, quantitative, pregnancy  Result Value Ref Range   hCG, Beta Chain, Quant, S 202,312 (H) <5 mIU/mL  POC Urine Pregnancy, ED (do NOT order at [facility])  Result Value Ref Range   Preg Test, Ur POSITIVE (A) NEGATIVE  GC/Chlamydia probe amp  Result Value Ref Range   Chlamydia Negative    Neisseria gonorrhea Negative    Dg Chest 2 View  Result Date: 01/19/2017 CLINICAL DATA:  Cough. EXAM: CHEST  2 VIEW COMPARISON:  10/11/2011 FINDINGS: Normal heart size and mediastinal contours. There is no edema, consolidation, effusion, or pneumothorax. Dextroscoliosis. IMPRESSION: Stable chest. Electronically Signed   By: Monte Fantasia M.D.   On: 01/19/2017 13:26  chest x-ray viewed by me Initial Impression / Assessment and Plan / ED Course  I have reviewed the triage vital signs and the nursing notes.  Pertinent labs & imaging results that were available during my care of the patient were reviewed by me and considered in my medical decision making (see chart for details).   1:30 PM patient comfortable, coughing occasionally. Likely that sig be secondary to vasovagal episode from coughing. With sore throat fever headache cough patient suffering influenza-like illness. Plan Tylenol encourage oral hydration. Keep scheduled appointment with PMD later this month. She is invited to return to the emergency department if her condition worsens for any reason    Final  Clinical Impressions(s) / ED Diagnoses  Diagnosis #1 cough syncope #2 influenza-like illness Final diagnoses:  None    New Prescriptions New Prescriptions   No medications on file     Orlie Dakin, MD 01/19/17 1341

## 2017-01-19 NOTE — ED Notes (Signed)
Patient transported to X-ray 

## 2017-03-27 IMAGING — CT CT HEAD W/O CM
4 series · 22 of 47 positions shown, 24 images · non-contrast
Comparison: None.

CLINICAL DATA: Syncope today

EXAM:
CT HEAD WITHOUT CONTRAST
TECHNIQUE: Contiguous axial images were obtained from the base of the skull
through the vertex without intravenous contrast.

[Series 201: head w/o, idose (1) · axial · non-contrast · 0.45mm/px · z∈[+122,+241]mm · 8 of 32 slices shown, 10 images]
[im 4/32  brain]
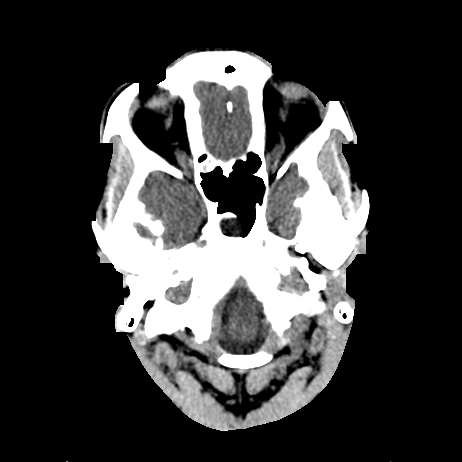
[im 4/32  bone]
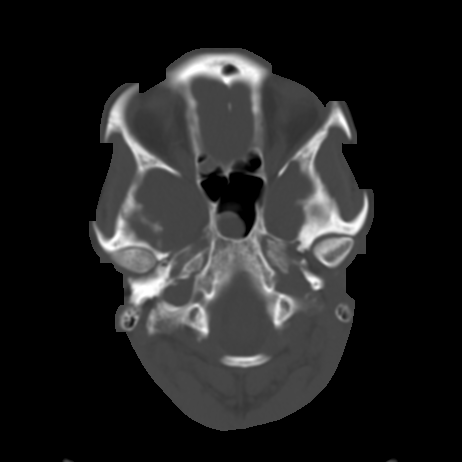
[im 7/32  brain]
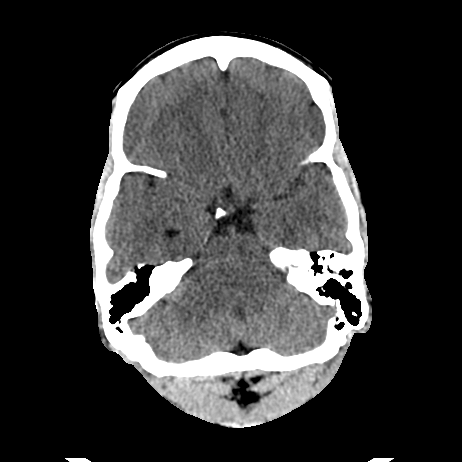
[im 11/32  brain]
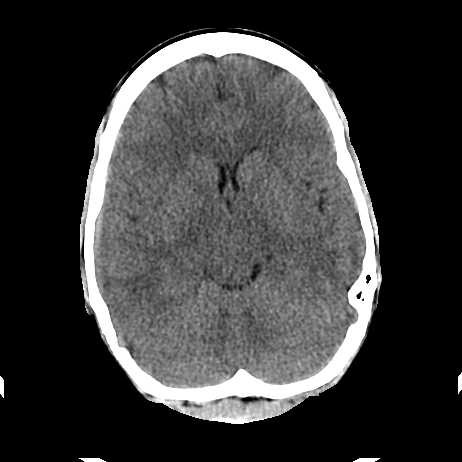
[im 14/32  brain]
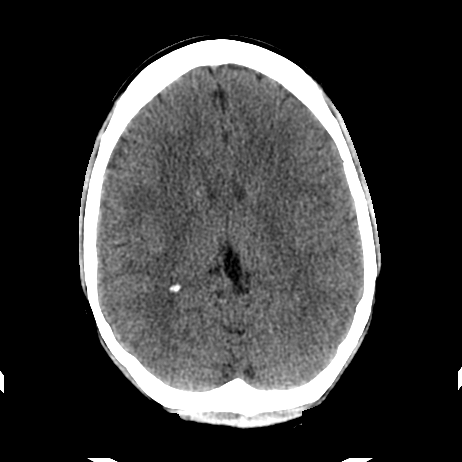
[im 18/32  brain]
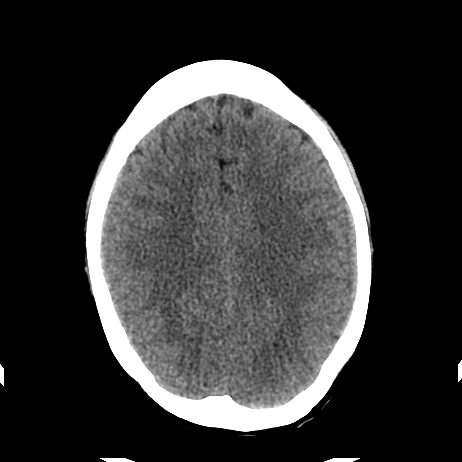
[im 18/32  bone]
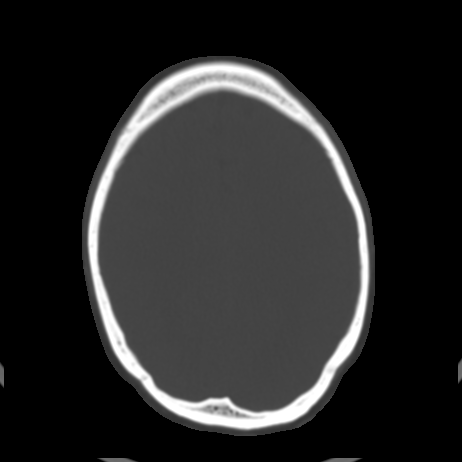
[im 21/32  brain]
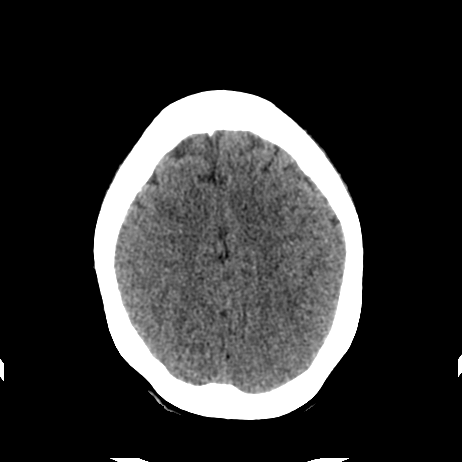
[im 25/32  brain]
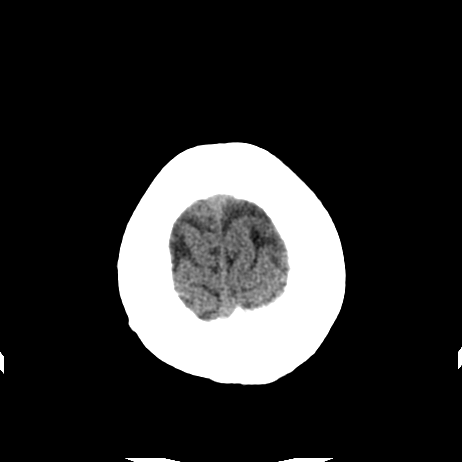
[im 28/32  brain]
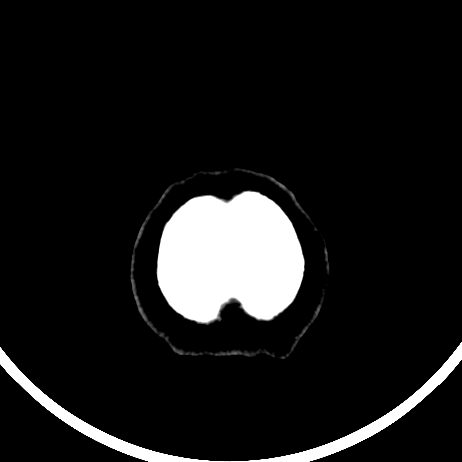

[Series 202: head w/o bone, idose (1) · axial · non-contrast · 0.45mm/px · z∈[+121,+244]mm · 8 of 64 slices shown]
[im 7/64  bone]
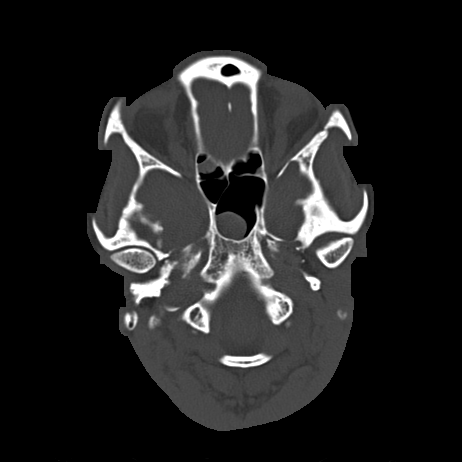
[im 14/64  bone]
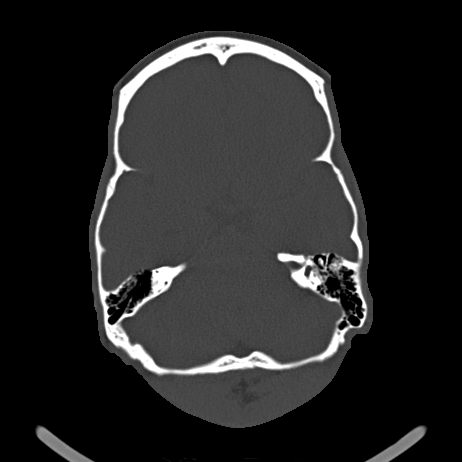
[im 20/64  bone]
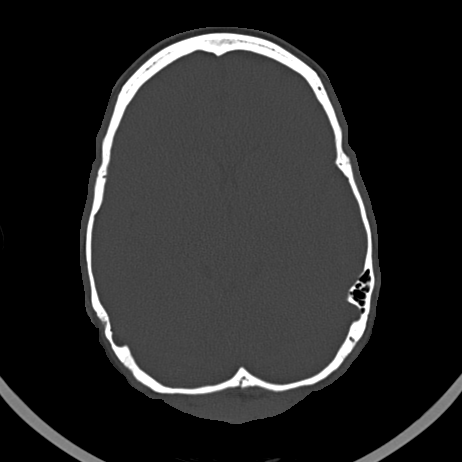
[im 27/64  bone]
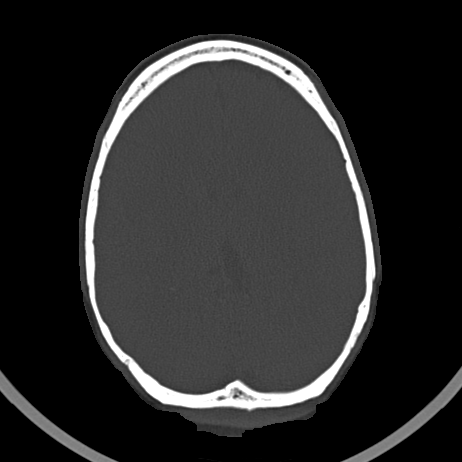
[im 37/64  bone]
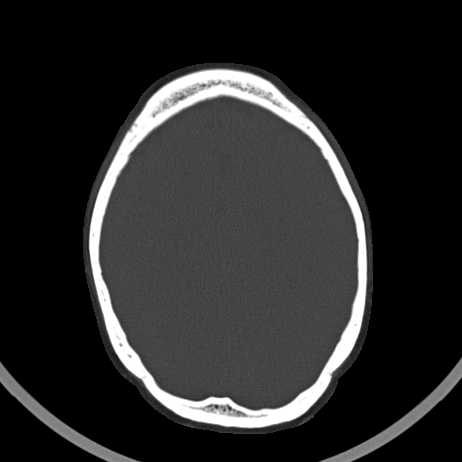
[im 44/64  bone]
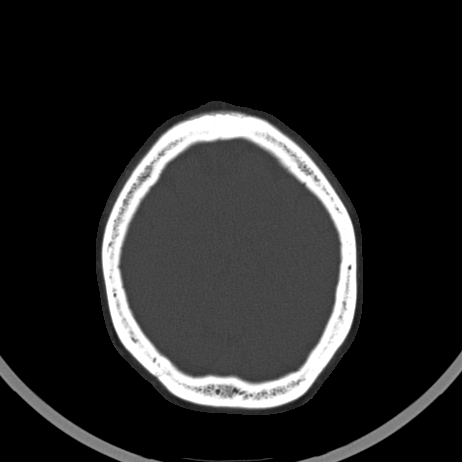
[im 50/64  bone]
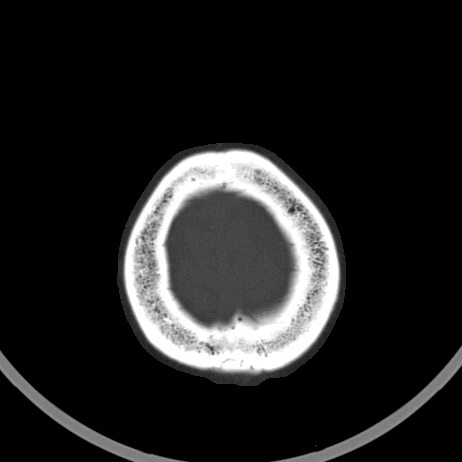
[im 57/64  bone]
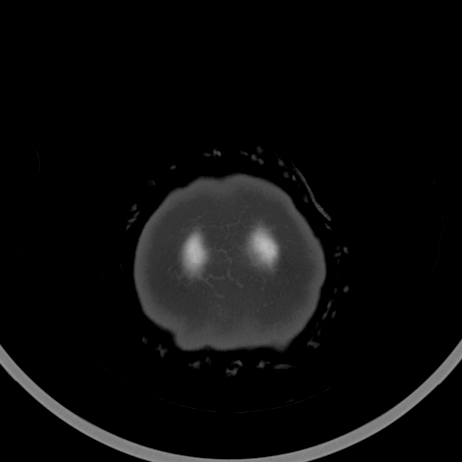

[Series 203: coronal st, idose (1) · coronal · 0.41mm/px · 3 of 66 slices shown]
[im 22/66  brain]
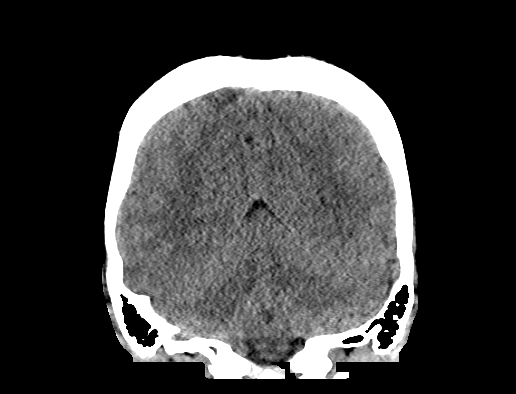
[im 29/66  brain]
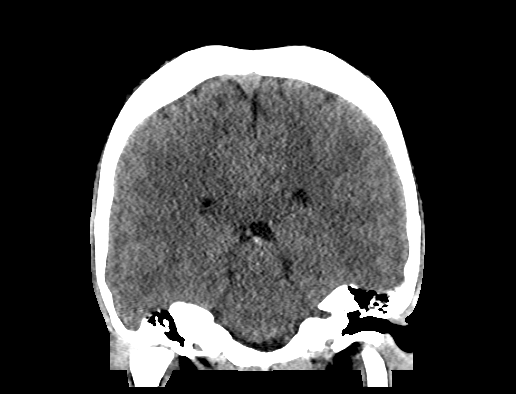
[im 37/66  brain]
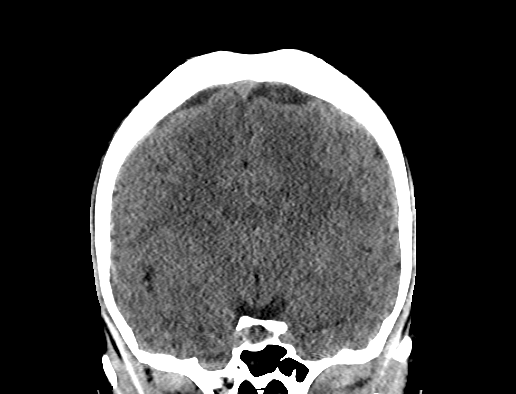

[Series 204: sagittal st, idose (1) · sagittal · 0.41mm/px · 3 of 69 slices shown]
[im 23/69  brain]
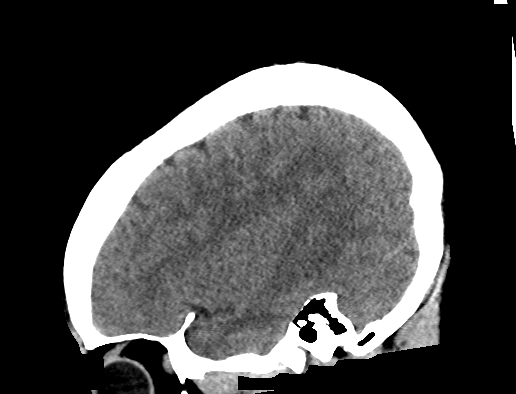
[im 35/69  brain]
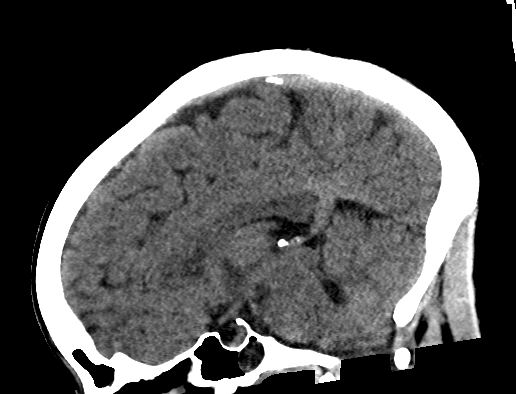
[im 46/69  brain]
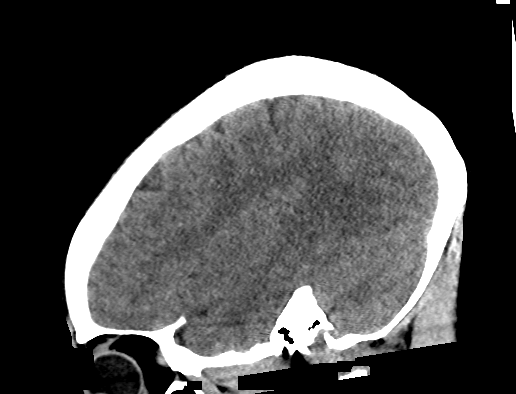

[22 of 47 positions shown; findings below may reference images not displayed]

FINDINGS: Brain: No intracranial hemorrhage, mass effect or midline shift. No
acute cortical infarction. No mass lesion is noted on this
unenhanced scan. No hydrocephalus. The gray and white-matter
differentiation is preserved.

Vascular: No hyperdense vessel or unexpected calcification.

Skull: No skull fracture is noted.

Sinuses/Orbits: There is mucosal thickening with partial
opacification left sphenoid sinus. Mucosal thickening with almost
complete opacification bilateral ethmoid air cells. Mild mucosal
thickening left frontal sinus. Mild mucosal thickening left
maxillary sinus.

Other: None
IMPRESSION: No acute intracranial abnormality. Paranasal sinuses disease as
described above.

## 2017-12-05 ENCOUNTER — Encounter (HOSPITAL_COMMUNITY): Payer: Self-pay

## 2017-12-05 ENCOUNTER — Other Ambulatory Visit: Payer: Self-pay

## 2017-12-05 ENCOUNTER — Emergency Department (HOSPITAL_COMMUNITY)
Admission: EM | Admit: 2017-12-05 | Discharge: 2017-12-05 | Disposition: A | Discharge status: Home / Self Care | Payer: Medicaid Other | Attending: Emergency Medicine | Admitting: Emergency Medicine

## 2017-12-05 DIAGNOSIS — Z5321 Procedure and treatment not carried out due to patient leaving prior to being seen by health care provider: Secondary | ICD-10-CM | POA: Insufficient documentation

## 2017-12-05 DIAGNOSIS — R1030 Lower abdominal pain, unspecified: Secondary | ICD-10-CM | POA: Insufficient documentation

## 2017-12-05 DIAGNOSIS — M545 Low back pain: Secondary | ICD-10-CM | POA: Insufficient documentation

## 2017-12-05 DIAGNOSIS — N898 Other specified noninflammatory disorders of vagina: Secondary | ICD-10-CM | POA: Insufficient documentation

## 2017-12-05 LAB — CBC
HEMATOCRIT: 34.5 % — AB (ref 36.0–46.0)
Hemoglobin: 11.1 g/dL — ABNORMAL LOW (ref 12.0–15.0)
MCH: 26.9 pg (ref 26.0–34.0)
MCHC: 32.2 g/dL (ref 30.0–36.0)
MCV: 83.5 fL (ref 78.0–100.0)
Platelets: 287 10*3/uL (ref 150–400)
RBC: 4.13 MIL/uL (ref 3.87–5.11)
RDW: 14.4 % (ref 11.5–15.5)
WBC: 5.3 10*3/uL (ref 4.0–10.5)

## 2017-12-05 LAB — COMPREHENSIVE METABOLIC PANEL
ALBUMIN: 3.2 g/dL — AB (ref 3.5–5.0)
ALK PHOS: 37 U/L — AB (ref 38–126)
ALT: 9 U/L (ref 0–44)
AST: 12 U/L — AB (ref 15–41)
Anion gap: 9 (ref 5–15)
BILIRUBIN TOTAL: 0.4 mg/dL (ref 0.3–1.2)
CO2: 22 mmol/L (ref 22–32)
Calcium: 9.1 mg/dL (ref 8.9–10.3)
Chloride: 104 mmol/L (ref 98–111)
Creatinine, Ser: 0.61 mg/dL (ref 0.44–1.00)
GFR calc Af Amer: >60 mL/min (ref >60)
GFR calc non Af Amer: >60 mL/min (ref >60)
GLUCOSE: 97 mg/dL (ref 70–99)
Potassium: 3.7 mmol/L (ref 3.5–5.1)
SODIUM: 135 mmol/L (ref 135–145)
TOTAL PROTEIN: 6.8 g/dL (ref 6.5–8.1)

## 2017-12-05 LAB — I-STAT BETA HCG BLOOD, ED (MC, WL, AP ONLY)

## 2017-12-05 LAB — URINALYSIS, ROUTINE W REFLEX MICROSCOPIC
Bilirubin Urine: NEGATIVE
Glucose, UA: NEGATIVE mg/dL
Ketones, ur: NEGATIVE mg/dL
Leukocytes, UA: NEGATIVE
NITRITE: NEGATIVE
Protein, ur: NEGATIVE mg/dL
SPECIFIC GRAVITY, URINE: 1.016 (ref 1.005–1.030)
pH: 7 (ref 5.0–8.0)

## 2017-12-05 LAB — LIPASE, BLOOD: Lipase: 33 U/L (ref 11–51)

## 2017-12-05 NOTE — ED Notes (Signed)
Unable to locate pt in lobby x 3

## 2017-12-05 NOTE — ED Notes (Signed)
Follow up call made  No answer  12/05/17  1252  s Shatori Bertucci rn

## 2017-12-05 NOTE — ED Triage Notes (Signed)
Pt states that she has been having lower abd pain, lower back pain with pressure since last night, reports heavy amount of brown vaginal discharge and hesitation with urination. Denies fevers

## 2019-07-13 ENCOUNTER — Emergency Department (HOSPITAL_COMMUNITY): Payer: Medicaid Other

## 2019-07-13 ENCOUNTER — Other Ambulatory Visit: Payer: Self-pay

## 2019-07-13 ENCOUNTER — Encounter (HOSPITAL_COMMUNITY): Payer: Self-pay

## 2019-07-13 ENCOUNTER — Emergency Department (HOSPITAL_COMMUNITY)
Admission: EM | Admit: 2019-07-13 | Discharge: 2019-07-13 | Disposition: A | Discharge status: Home / Self Care | Payer: Medicaid Other | Attending: Emergency Medicine | Admitting: Emergency Medicine

## 2019-07-13 DIAGNOSIS — Y939 Activity, unspecified: Secondary | ICD-10-CM | POA: Insufficient documentation

## 2019-07-13 DIAGNOSIS — S63501A Unspecified sprain of right wrist, initial encounter: Secondary | ICD-10-CM | POA: Insufficient documentation

## 2019-07-13 DIAGNOSIS — S29019A Strain of muscle and tendon of unspecified wall of thorax, initial encounter: Secondary | ICD-10-CM | POA: Diagnosis not present

## 2019-07-13 DIAGNOSIS — Y92410 Unspecified street and highway as the place of occurrence of the external cause: Secondary | ICD-10-CM | POA: Insufficient documentation

## 2019-07-13 DIAGNOSIS — S6991XA Unspecified injury of right wrist, hand and finger(s), initial encounter: Secondary | ICD-10-CM | POA: Diagnosis present

## 2019-07-13 DIAGNOSIS — Z79899 Other long term (current) drug therapy: Secondary | ICD-10-CM | POA: Insufficient documentation

## 2019-07-13 DIAGNOSIS — Y999 Unspecified external cause status: Secondary | ICD-10-CM | POA: Diagnosis not present

## 2019-07-13 MED ORDER — CYCLOBENZAPRINE HCL 10 MG PO TABS: 10.0000 mg | ORAL_TABLET | Freq: Three times a day (TID) | ORAL | 0 refills | Status: DC | PRN

## 2019-07-13 MED ORDER — CYCLOBENZAPRINE HCL 10 MG PO TABS
10.0000 mg | ORAL_TABLET | Freq: Once | ORAL | Status: AC
  Administered 2019-07-13: 05:00:00 10 mg via ORAL
  Filled 2019-07-13: qty 1
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
## 2019-07-13 NOTE — Discharge Instructions (Signed)
Take ibuprofen 600 mg every 6 hours as needed for pain.  Take Flexeril as prescribed as needed for pain not relieved with ibuprofen.  Rest.  Follow-up with primary doctor if symptoms or not improving in the next week.
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
## 2019-07-13 NOTE — ED Notes (Signed)
Pt verbalized understanding of d/c instructions, follow up care, [facility] and s/s requiring return to ed. Pt had no further questions.

## 2021-02-03 DIAGNOSIS — D62 Acute posthemorrhagic anemia: Secondary | ICD-10-CM | POA: Insufficient documentation

## 2021-02-03 DIAGNOSIS — D509 Iron deficiency anemia, unspecified: Secondary | ICD-10-CM | POA: Insufficient documentation

## 2021-03-13 ENCOUNTER — Encounter (HOSPITAL_COMMUNITY): Payer: Self-pay

## 2021-03-13 ENCOUNTER — Ambulatory Visit (HOSPITAL_COMMUNITY)
Admission: EM | Admit: 2021-03-13 | Discharge: 2021-03-13 | Disposition: A | Discharge status: Home / Self Care | Payer: Medicaid Other

## 2021-03-13 ENCOUNTER — Other Ambulatory Visit: Payer: Self-pay

## 2021-03-13 DIAGNOSIS — J3089 Other allergic rhinitis: Secondary | ICD-10-CM

## 2021-03-13 DIAGNOSIS — R0689 Other abnormalities of breathing: Secondary | ICD-10-CM

## 2021-03-13 DIAGNOSIS — Z862 Personal history of diseases of the blood and blood-forming organs and certain disorders involving the immune mechanism: Secondary | ICD-10-CM

## 2021-03-13 DIAGNOSIS — J302 Other seasonal allergic rhinitis: Secondary | ICD-10-CM | POA: Diagnosis not present

## 2021-03-13 DIAGNOSIS — H66003 Acute suppurative otitis media without spontaneous rupture of ear drum, bilateral: Secondary | ICD-10-CM | POA: Diagnosis not present

## 2021-03-13 LAB — POCT RAPID STREP A, ED / UC: Streptococcus, Group A Screen (Direct): NEGATIVE

## 2021-03-13 MED ORDER — CEFDINIR 300 MG PO CAPS: 300.0000 mg | ORAL_CAPSULE | Freq: Two times a day (BID) | ORAL | 0 refills | Status: AC

## 2021-03-13 MED ORDER — IPRATROPIUM BROMIDE 0.06 % NA SOLN: 2.0000 | Freq: Four times a day (QID) | NASAL | 12 refills | Status: DC

## 2021-03-13 MED ORDER — ALBUTEROL SULFATE HFA 108 (90 BASE) MCG/ACT IN AERS: 2.0000 | INHALATION_SPRAY | Freq: Four times a day (QID) | RESPIRATORY_TRACT | 0 refills | Status: DC | PRN

## 2021-03-13 MED ORDER — PROMETHAZINE-DM 6.25-15 MG/5ML PO SYRP: 5.0000 mL | ORAL_SOLUTION | Freq: Four times a day (QID) | ORAL | 0 refills | Status: DC | PRN

## 2021-03-13 MED ORDER — AEROCHAMBER PLUS FLO-VU MEDIUM MISC: 1.0000 | Freq: Once | 0 refills | Status: AC

## 2021-03-13 MED ORDER — FLUTICASONE PROPIONATE 50 MCG/ACT NA SUSP: 1.0000 | Freq: Every day | NASAL | 0 refills | Status: DC

## 2021-03-13 NOTE — Discharge Instructions (Addendum)
Begin cefdinir 1 daily for the next 7 days to address the ear infection in both ears.  Dry mucous membranes, please begin Flonase nasal spray and Atrovent nasal spray as directed.  To improve breath sounds, decreased your urge to cough and open up your breathing, please inhale 2 puffs of albuterol 4 times daily for the next few days, then decrease to twice daily for a few more days, then use only as needed for shortness of breath, cough or wheezing.  If you have not had significant resolution of your symptoms of the next 3 to 5 days, please follow-up for repeat evaluation and further treatment as needed.

## 2021-03-13 NOTE — ED Provider Notes (Signed)
[facility]    CSN: [id number] Arrival date & time: 03/13/21  1010    HISTORY   Chief Complaint  Patient presents with   Fever   Cough   Sore Throat   HPI Marcia Hanson is a 40 y.o. female. Pt reports cough x 8 days; sore throat, fever 102.0 F, diarrhea and nasal congestion x 4 days. Cough is worse at night. Tylenol gives some relief.  Patient is afebrile on arrival however during physical exam her skin is appreciably warm to touch.  Patient states cough is not productive.  Patient states the nasal congestion is clear.  Patient states he is also tried Sudafed which provides temporary relief but then has a complete return of symptoms after a few hours.  The history is provided by the patient.  Past Medical History:  Diagnosis Date   Cancer ([place])    Seizures ([place])    Syncope    Patient Active Problem List   Diagnosis Date Noted   Syncope    History reviewed. No pertinent surgical history. OB History   No obstetric history on file.    Home Medications    Prior to Admission medications   Medication Sig Start Date End Date Taking? Authorizing Provider  acetaminophen (TYLENOL) 500 MG tablet Take 500 mg by mouth every 6 (six) hours as needed.   Yes [provider]  albuterol (VENTOLIN HFA) 108 (90 Base) MCG/ACT inhaler Inhale 2 puffs into the lungs every 6 (six) hours as needed for wheezing or shortness of breath (Cough). 03/13/21  Yes Lynden Oxford Scales, PA-C  cefdinir (OMNICEF) 300 MG capsule Take 1 capsule (300 mg total) by mouth 2 (two) times daily for 7 days. 03/13/21 03/20/21 Yes Lynden Oxford Scales, PA-C  fluticasone (FLONASE) 50 MCG/ACT nasal spray Place 1 spray into both nostrils daily. 03/13/21  Yes Lynden Oxford Scales, PA-C  ipratropium (ATROVENT) 0.06 % nasal spray Place 2 sprays into both nostrils 4 (four) times daily. As needed for nasal congestion, runny nose 03/13/21  Yes Lynden Oxford Scales, PA-C  Spacer/Aero-Holding Chambers  (AEROCHAMBER PLUS FLO-VU MEDIUM) MISC 1 each by Other route once for 1 dose. 03/13/21 03/13/21 Yes Lynden Oxford Scales, PA-C   Family History History reviewed. No pertinent family history. Social History Social History   Tobacco Use   Smoking status: Never   Smokeless tobacco: Never  Substance Use Topics   Alcohol use: No   Drug use: No   Allergies   Avocado  Review of Systems Review of Systems Pertinent findings noted in history of present illness.   Physical Exam Triage Vital Signs ED Triage Vitals  Enc Vitals Group     BP 02/04/21 0827 (!) 147/82     Pulse Rate 02/04/21 0827 72     Resp 02/04/21 0827 18     Temp 02/04/21 0827 98.3 F (36.8 C)     Temp Source 02/04/21 0827 Oral     SpO2 02/04/21 0827 98 %     Weight --      Height --      Head Circumference --      Peak Flow --      Pain Score 02/04/21 0826 5     Pain Loc --      Pain Edu? --      Excl. in [place]? --   No data found.  Updated Vital Signs BP 134/82 (BP Location: Left Arm)   Pulse 61   Temp 98.1 F (36.7 C) (Oral)  Resp 18   LMP  (Within Months) Comment: 1 month  SpO2 97%   Physical Exam Vitals and nursing note reviewed.  Constitutional:      General: She is not in acute distress.    Appearance: Normal appearance. She is not ill-appearing.  HENT:     Head: Normocephalic and atraumatic.     Salivary Glands: Right salivary gland is not diffusely enlarged or tender. Left salivary gland is not diffusely enlarged or tender.     Right Ear: Ear canal and external ear normal. No drainage. A middle ear effusion (Suppurative) is present. There is no impacted cerumen. Tympanic membrane is injected and bulging. Tympanic membrane is not erythematous.     Left Ear: Ear canal and external ear normal. No drainage. A middle ear effusion (Suppurative) is present. There is no impacted cerumen. Tympanic membrane is injected and bulging. Tympanic membrane is not erythematous.     Ears:     Comments: Bilateral  EACs normal, both TMs bulging with clear fluid    Nose: Rhinorrhea present. No nasal deformity, septal deviation, signs of injury, nasal tenderness, mucosal edema or congestion. Rhinorrhea is clear.     Right Nostril: Occlusion present. No foreign body, epistaxis or septal hematoma.     Left Nostril: Occlusion present. No foreign body, epistaxis or septal hematoma.     Right Turbinates: Enlarged, swollen and pale.     Left Turbinates: Enlarged, swollen and pale.     Right Sinus: No maxillary sinus tenderness or frontal sinus tenderness.     Left Sinus: No maxillary sinus tenderness or frontal sinus tenderness.     Mouth/Throat:     Lips: Pink. No lesions.     Mouth: Mucous membranes are moist. No oral lesions.     Pharynx: Oropharynx is clear. Uvula midline. No posterior oropharyngeal erythema or uvula swelling.     Tonsils: No tonsillar exudate. 0 on the right. 0 on the left.     Comments: Postnasal drip Eyes:     General: Lids are normal.        Right eye: No discharge.        Left eye: No discharge.     Extraocular Movements: Extraocular movements intact.     Conjunctiva/sclera: Conjunctivae normal.     Right eye: Right conjunctiva is not injected.     Left eye: Left conjunctiva is not injected.  Neck:     Trachea: Trachea and phonation normal.  Cardiovascular:     Rate and Rhythm: Normal rate and regular rhythm.     Pulses: Normal pulses.     Heart sounds: Normal heart sounds. No murmur heard.   No friction rub. No gallop.  Pulmonary:     Effort: Pulmonary effort is normal. No accessory muscle usage, prolonged expiration or respiratory distress.     Breath sounds: No stridor, decreased air movement or transmitted upper airway sounds. Examination of the right-upper field reveals decreased breath sounds. Examination of the left-upper field reveals decreased breath sounds. Examination of the right-middle field reveals decreased breath sounds. Examination of the left-middle field  reveals decreased breath sounds. Decreased breath sounds present. No wheezing, rhonchi or rales.  Chest:     Chest wall: No tenderness.  Musculoskeletal:        General: Normal range of motion.     Cervical back: Normal range of motion and neck supple. Normal range of motion.  Lymphadenopathy:     Cervical: No cervical adenopathy.  Skin:    General:  Skin is warm and dry.     Findings: No erythema or rash.  Neurological:     General: No focal deficit present.     Mental Status: She is alert and oriented to person, place, and time.  Psychiatric:        Mood and Affect: Mood normal.        Behavior: Behavior normal.    Visual Acuity Right Eye Distance:   Left Eye Distance:   Bilateral Distance:    Right Eye Near:   Left Eye Near:    Bilateral Near:     UC Couse / Diagnostics / Procedures:    EKG  Radiology No results found.  Procedures Procedures (including critical care time)  UC Diagnoses / Final Clinical Impressions(s)   I have reviewed the triage vital signs and the nursing notes.  Pertinent labs & imaging results that were available during my care of the patient were reviewed by me and considered in my medical decision making (see chart for details).   Final diagnoses:  History of iron deficiency anemia  Perennial allergic rhinitis with seasonal variation  Decreased breath sounds  Acute suppurative otitis media of both ears without spontaneous rupture of tympanic membranes, recurrence not specified   Bilateral suppurative otitis media, significant large meant of turbinates bilaterally with clear drainage.  Breath sounds diminished throughout mid and upper lung fields bilaterally, no wheezing appreciated.  Begin albuterol 2 puffs 4 times daily for the next 2 to 3 days, then decrease to twice daily for a few more days then stop and use only as needed for cough, shortness of breath, wheezing.  Patient provided with 7-day course of cefdinir for bilateral otitis media.   Patient advised to begin using Atrovent nasal spray and Flonase routinely.  ED Prescriptions     Medication Sig Dispense Auth. Provider   cefdinir (OMNICEF) 300 MG capsule Take 1 capsule (300 mg total) by mouth 2 (two) times daily for 7 days. 14 capsule Lynden Oxford Scales, PA-C   fluticasone (FLONASE) 50 MCG/ACT nasal spray Place 1 spray into both nostrils daily. 18 mL Lynden Oxford Scales, PA-C   albuterol (VENTOLIN HFA) 108 (90 Base) MCG/ACT inhaler Inhale 2 puffs into the lungs every 6 (six) hours as needed for wheezing or shortness of breath (Cough). 18 g Lynden Oxford Scales, PA-C   ipratropium (ATROVENT) 0.06 % nasal spray Place 2 sprays into both nostrils 4 (four) times daily. As needed for nasal congestion, runny nose 15 mL Lynden Oxford Scales, PA-C   Spacer/Aero-Holding Chambers (AEROCHAMBER PLUS FLO-VU MEDIUM) MISC 1 each by Other route once for 1 dose. 1 each Lynden Oxford Scales, PA-C      PDMP not reviewed this encounter.  Pending results:  Labs Reviewed  POCT RAPID STREP A, ED / UC    Medications Ordered in UC: Medications - No data to display  Disposition Upon Discharge:  Condition: stable for discharge home Home: take medications as prescribed; routine discharge instructions as discussed; follow up as advised.  Patient presented with an acute illness with associated systemic symptoms and significant discomfort requiring urgent management. In my opinion, this is a condition that a prudent lay person (someone who possesses an average knowledge of health and medicine) may potentially expect to result in complications if not addressed urgently such as respiratory distress, impairment of bodily function or dysfunction of bodily organs.   Routine symptom specific, illness specific and/or disease specific instructions were discussed with the patient and/or caregiver at length.  As such, the patient has been evaluated and assessed, work-up was performed and  treatment was provided in alignment with urgent care protocols and evidence based medicine.  Patient/parent/caregiver has been advised that the patient may require follow up for further testing and treatment if the symptoms continue in spite of treatment, as clinically indicated and appropriate.  The patient was tested for COVID-19, Influenza and/or RSV, then the patient/parent/guardian was advised to isolate at home pending the results of his/her diagnostic coronavirus test and potentially longer if they're positive. I have also advised pt that if his/her COVID-19 test returns positive, it's recommended to self-isolate for at least 10 days after symptoms first appeared AND until fever-free for 24 hours without fever reducer AND other symptoms have improved or resolved. Discussed self-isolation recommendations as well as instructions for household member/close contacts as per the [facility] and [place] DHHS, and also gave patient the [place] packet with this information.  Patient/parent/caregiver has been advised to return to the [facility] or PCP in 3-5 days if no better; to PCP or the Emergency Department if new signs and symptoms develop, or if the current signs or symptoms continue to change or worsen for further workup, evaluation and treatment as clinically indicated and appropriate  The patient will follow up with their current PCP if and as advised. If the patient does not currently have a PCP we will assist them in obtaining one.   The patient may need specialty follow up if the symptoms continue, in spite of conservative treatment and management, for further workup, evaluation, consultation and treatment as clinically indicated and appropriate.  Patient/parent/caregiver verbalized understanding and agreement of plan as discussed.  All questions were addressed during visit.  Please see discharge instructions below for further details of plan.  Discharge Instructions:   Discharge Instructions      Begin  cefdinir 1 daily for the next 7 days to address the ear infection in both ears.  Dry mucous membranes, please begin Flonase nasal spray and Atrovent nasal spray as directed.  To improve breath sounds, decreased your urge to cough and open up your breathing, please inhale 2 puffs of albuterol 4 times daily for the next few days, then decrease to twice daily for a few more days, then use only as needed for shortness of breath, cough or wheezing.  If you have not had significant resolution of your symptoms of the next 3 to 5 days, please follow-up for repeat evaluation and further treatment as needed.        Lynden Oxford Scales, PA-C 03/13/21 1204

## 2021-03-13 NOTE — ED Triage Notes (Signed)
Pt reports cough x 8 days; sore throat, fever 102.0 F, diarrhea and nasal congestion x 4 days. Cough is worse at night. Tylenol gives some relief.

## 2022-05-04 ENCOUNTER — Encounter (HOSPITAL_COMMUNITY): Payer: Self-pay | Admitting: Obstetrics & Gynecology

## 2022-05-04 ENCOUNTER — Inpatient Hospital Stay (HOSPITAL_COMMUNITY)
Admission: AD | Admit: 2022-05-04 | Discharge: 2022-05-04 | Disposition: A | Discharge status: Home / Self Care | Payer: Medicaid Other | Attending: Obstetrics & Gynecology | Admitting: Obstetrics & Gynecology

## 2022-05-04 ENCOUNTER — Other Ambulatory Visit: Payer: Self-pay

## 2022-05-04 ENCOUNTER — Inpatient Hospital Stay (HOSPITAL_COMMUNITY): Payer: Medicaid Other

## 2022-05-04 DIAGNOSIS — Z3491 Encounter for supervision of normal pregnancy, unspecified, first trimester: Secondary | ICD-10-CM

## 2022-05-04 DIAGNOSIS — O09521 Supervision of elderly multigravida, first trimester: Secondary | ICD-10-CM | POA: Diagnosis not present

## 2022-05-04 DIAGNOSIS — O209 Hemorrhage in early pregnancy, unspecified: Secondary | ICD-10-CM

## 2022-05-04 DIAGNOSIS — O26891 Other specified pregnancy related conditions, first trimester: Secondary | ICD-10-CM | POA: Diagnosis present

## 2022-05-04 DIAGNOSIS — Z3A08 8 weeks gestation of pregnancy: Secondary | ICD-10-CM | POA: Insufficient documentation

## 2022-05-04 LAB — CBC
HCT: 38.7 % (ref 36.0–46.0)
Hemoglobin: 13.4 g/dL (ref 12.0–15.0)
MCH: 30.5 pg (ref 26.0–34.0)
MCHC: 34.6 g/dL (ref 30.0–36.0)
MCV: 88 fL (ref 80.0–100.0)
Platelets: 320 10*3/uL (ref 150–400)
RBC: 4.4 MIL/uL (ref 3.87–5.11)
RDW: 12.9 % (ref 11.5–15.5)
WBC: 7.3 10*3/uL (ref 4.0–10.5)
nRBC: 0 % (ref 0.0–0.2)

## 2022-05-04 LAB — URINALYSIS, ROUTINE W REFLEX MICROSCOPIC
Bilirubin Urine: NEGATIVE
Glucose, UA: NEGATIVE mg/dL
Hgb urine dipstick: NEGATIVE
Ketones, ur: NEGATIVE mg/dL
Leukocytes,Ua: NEGATIVE
Nitrite: NEGATIVE
Protein, ur: NEGATIVE mg/dL
Specific Gravity, Urine: 1.023 (ref 1.005–1.030)
pH: 7 (ref 5.0–8.0)

## 2022-05-04 LAB — HCG, QUANTITATIVE, PREGNANCY: hCG, Beta Chain, Quant, S: 224343 m[IU]/mL — ABNORMAL HIGH (ref <5)

## 2022-05-04 LAB — ABO/RH: ABO/RH(D): O POS

## 2022-05-04 LAB — WET PREP, GENITAL
Clue Cells Wet Prep HPF POC: NONE SEEN
Sperm: NONE SEEN
Trich, Wet Prep: NONE SEEN
WBC, Wet Prep HPF POC: <10 (ref <10)
Yeast Wet Prep HPF POC: NONE SEEN

## 2022-05-04 LAB — POCT PREGNANCY, URINE: Preg Test, Ur: POSITIVE — AB

## 2022-05-04 NOTE — MAU Note (Signed)
Marcia Hanson is a 42 y.o. at 49w1dhere in MAU reporting: this AM had an episode of severe cramping and then saw bright red bleeding. Bleeding is now lighter and only when she wipes. Cramping has improved.   LMP: 03/08/22, irregular periods  Onset of complaint: today  Pain score: 4/10  Vitals:   05/04/22 1146  BP: 117/71  Pulse: 83  Resp: 16  Temp: 98.3 F (36.8 C)  SpO2: 99%     FHT:NA  Lab orders placed from triage: upt, ua

## 2022-05-04 NOTE — Discharge Instructions (Signed)
[facility]   [facility]             [address], [place], [place] [address] [phone]  [facility]                                                             [address], [address], [place], [place], [address] [phone]  [facility]                                    [address], [address], [place], [place], [address] [phone]  [facility] [place] [address], [place], [place], [address] [phone]  [facility]                                 [address], [place], [place], [address] [phone]  [facility]                                    [address], [place], [place], [address] [phone]  [facility] [address], [address], [place], [place], [address]                              [facility] [address], [place], [place], [address] [phone]  [facility]         Phone: [phone]  [facility]      Phone: [phone]   [facility]      Phone: [phone]  [facility]         Phone: [phone]   [facility]    Phone: [phone]  [facility]      Phone: [phone]  [facility]     Phone: [phone]  [facility]        Phone: [phone]  [facility]      Phone: [phone]                   Safe Medications in Pregnancy    Acne: Benzoyl Peroxide Salicylic Acid  Backache/Headache: Tylenol: 2 regular strength every 4 hours OR              2 Extra strength every 6  hours  Colds/Coughs/Allergies: Benadryl (alcohol free) 25 mg every 6 hours as needed Breath right strips Claritin Cepacol throat lozenges Chloraseptic throat spray Cold-Eeze- up to three times per day Cough drops, alcohol free Flonase (by prescription only) Guaifenesin Mucinex Robitussin DM (plain only, alcohol free) Saline nasal spray/drops Sudafed (pseudoephedrine) & Actifed ** use only after [redacted] weeks gestation and if you do not have high blood pressure Tylenol Vicks Vaporub Zinc lozenges Zyrtec   Constipation: Colace Ducolax suppositories Fleet enema Glycerin   suppositories Metamucil Milk of magnesia Miralax Senokot Smooth move tea  Diarrhea: Kaopectate Imodium A-D  *NO pepto Bismol  Hemorrhoids: Anusol Anusol HC Preparation H Tucks  Indigestion: Tums Maalox Mylanta Zantac  Pepcid  Insomnia: Benadryl (alcohol free) 25mg every 6 hours as needed Tylenol PM Unisom, no Gelcaps  Leg Cramps: Tums MagGel  Nausea/Vomiting:  Bonine Dramamine Emetrol Ginger extract Sea bands Meclizine  Nausea medication to take during pregnancy:  Unisom (doxylamine succinate 25 mg tablets) Take one tablet daily at bedtime. If symptoms are not adequately controlled, the dose can be increased to a maximum recommended dose of two tablets daily (1/2 tablet in the morning, 1/2 tablet mid-afternoon and one at bedtime). Vitamin B6 100mg tablets. Take one tablet twice a day (up to 200 mg per day).  Skin Rashes: Aveeno products Benadryl cream or 25mg every 6 hours as needed Calamine Lotion 1% cortisone cream  Yeast infection: Gyne-lotrimin 7 Monistat 7   **If taking multiple medications, please check labels to avoid duplicating the same active ingredients **take medication as directed on the label ** Do not exceed 4000 mg of tylenol in 24 hours **Do not take medications that contain aspirin or ibuprofen    

## 2022-05-04 NOTE — MAU Provider Note (Signed)
[facility]     CSN: [id number]  Arrival date and time: 05/04/22 1109   None     Chief Complaint  Patient presents with   Abdominal Pain   Vaginal Bleeding   HPI Marcia Hanson is a 42 y.o. G3P0020 at 90w1dby LMP who presents to MAU for vaginal bleeding and lower abdominal cramping. She reports earlier today she had a very intense cramp and went to the bathroom and had a gush of bright red blood with some small clots. She reports at the time she felt weak and like her fingertips were numb. She reports since arrival the weakness and numbness have subsided and bleeding has been minimal. She reports she had a positive upt at home several days ago and has an appointment on Monday with her PCP for pregnancy confirmation. She denies itching, odor, or urinary s/s. She reports LMP was approximately 03/08/22 although has a history of irregular periods.    OB History     Gravida  3   Para  0   Term  0   Preterm  0   AB  2   Living  0      SAB  2   IAB  0   Ectopic  0   Multiple  0   Live Births  0           Past Medical History:  Diagnosis Date   [place]cer ([place]    [place]    [place]     No past surgical history on file.  No family history on file.  Social History   Tobacco Use   Smoking status: Never   Smokeless tobacco: Never  Substance Use Topics   Alcohol use: No   Drug use: No    Allergies:  Allergies  Allergen Reactions   Avocado Rash    No medications prior to admission.   Review of Systems  Constitutional: Negative.   Respiratory: Negative.    Cardiovascular: Negative.   Gastrointestinal:  Positive for abdominal pain (cramping).  Genitourinary:  Positive for vaginal bleeding.  Musculoskeletal: Negative.   Neurological: Negative.    Physical Exam   Blood pressure 117/71, pulse 83, temperature 98.3 F (36.8 C), temperature source Oral, resp. rate 16, height '5\' 5"'$  (1.651 m), weight 61.4 kg, last menstrual period 03/08/2022,  SpO2 99 %.  Physical Exam Vitals and nursing note reviewed.  Constitutional:      General: She is not in acute distress. Cardiovascular:     Rate and Rhythm: Normal rate.  Pulmonary:     Effort: Pulmonary effort is normal.  Abdominal:     Palpations: Abdomen is soft.     Tenderness: There is no abdominal tenderness.  Genitourinary:    Comments: Patient self-swabbed Skin:    General: Skin is warm and dry.  Neurological:     General: No focal deficit present.     Mental Status: She is alert and oriented to person, place, and time.  Psychiatric:        Mood and Affect: Mood normal.        Behavior: Behavior normal.     Results for orders placed or performed during the hospital encounter of 05/04/22 (from the past 24 hour(s))  Urinalysis, Routine w reflex microscopic -Urine, Clean Catch     Status: None   Collection Time: 05/04/22 11:24 AM  Result Value Ref Range   Color, Urine YELLOW YELLOW   APPearance CLEAR CLEAR   Specific Gravity, Urine 1.023 1.005 -  1.030   pH 7.0 5.0 - 8.0   Glucose, UA NEGATIVE NEGATIVE mg/dL   Hgb urine dipstick NEGATIVE NEGATIVE   Bilirubin Urine NEGATIVE NEGATIVE   Ketones, ur NEGATIVE NEGATIVE mg/dL   Protein, ur NEGATIVE NEGATIVE mg/dL   Nitrite NEGATIVE NEGATIVE   Leukocytes,Ua NEGATIVE NEGATIVE  Pregnancy, urine POC     Status: Abnormal   Collection Time: 05/04/22 11:25 AM  Result Value Ref Range   Preg Test, Ur POSITIVE (A) NEGATIVE  CBC     Status: None   Collection Time: 05/04/22 12:16 PM  Result Value Ref Range   WBC 7.3 4.0 - 10.5 K/uL   RBC 4.40 3.87 - 5.11 MIL/uL   Hemoglobin 13.4 12.0 - 15.0 g/dL   HCT 38.7 36.0 - 46.0 %   MCV 88.0 80.0 - 100.0 fL   MCH 30.5 26.0 - 34.0 pg   MCHC 34.6 30.0 - 36.0 g/dL   RDW 12.9 11.5 - 15.5 %   Platelets 320 150 - 400 K/uL   nRBC 0.0 0.0 - 0.2 %  hCG, quantitative, pregnancy     Status: Abnormal   Collection Time: 05/04/22 12:16 PM  Result Value Ref Range   hCG, Beta Chain, Quant, S  224,343 (H) <5 mIU/mL  ABO/Rh     Status: None   Collection Time: 05/04/22 12:16 PM  Result Value Ref Range   ABO/RH(D) O POS    No rh immune globuloin      NOT A RH IMMUNE GLOBULIN CANDIDATE, PT RH POSITIVE Performed at [facility], [address], [place], [place] [address]   Wet prep, genital     Status: None   Collection Time: 05/04/22 12:21 PM  Result Value Ref Range   Yeast Wet Prep HPF POC NONE SEEN NONE SEEN   Trich, Wet Prep NONE SEEN NONE SEEN   Clue Cells Wet Prep HPF POC NONE SEEN NONE SEEN   WBC, Wet Prep HPF POC <10 <10   Sperm NONE SEEN    US OB Comp Less 14 Wks  Result Date: 05/04/2022 CLINICAL DATA:  Severe pelvic cramping with vaginal bleeding. Estimated gestational age of [redacted] weeks, 1 day by LMP. EXAM: OBSTETRIC <14 WK ULTRASOUND TECHNIQUE: Transabdominal ultrasound was performed for evaluation of the gestation as well as the maternal uterus and adnexal regions. COMPARISON:  None Available. FINDINGS: Intrauterine gestational sac: Single. Yolk sac:  Not Visualized. Embryo:  Visualized. Cardiac Activity: Visualized. Heart Rate: 173 bpm CRL:   17.1 mm   8 w 1 d                  [place] EDC: 12/13/2022 Subchorionic hemorrhage:  None visualized. Maternal uterus/adnexae: Small 1.4 cm fibroid in the right uterus. The ovaries are unremarkable. No free fluid. IMPRESSION: 1. Single live intrauterine pregnancy with estimated gestational age of [redacted] weeks, 1 day. No acute abnormality. Electronically Signed   By: Titus Dubin M.D.   On: 05/04/2022 13:30    MAU Course  Procedures  MDM UA CBC, HCG, ABO/RH Wet prep, GC/CT [place]  UA negative. Labs reassuring. Blood type is O positive, Rhogam not indicated. Wet prep negative. GC/CT pending. US shows single living IUP correlating with LMP. List of OBGYN's and safe meds provided.   Assessment and Plan  [redacted] weeks gestation of pregnancy SIUP Vaginal bleeding affecting pregnancy  - Discharge home in stable condition - List of OBGYN's  and safe meds - Pelvic rest. Strict return precautions reviewed - Follow up  with OBGYN of choice to establish [place], CNM 05/04/2022, 2:18 PM

## 2022-05-05 LAB — GC/CHLAMYDIA PROBE AMP (~~LOC~~) NOT AT ARMC
Chlamydia: NEGATIVE
Comment: NEGATIVE
Comment: NORMAL
Neisseria Gonorrhea: NEGATIVE

## 2022-05-15 ENCOUNTER — Ambulatory Visit: Payer: Medicaid Other | Admitting: *Deleted

## 2022-05-15 VITALS — BP 128/79 | HR 71 | Ht 65.0 in | Wt 138.3 lb

## 2022-05-15 DIAGNOSIS — C801 Malignant (primary) neoplasm, unspecified: Secondary | ICD-10-CM | POA: Insufficient documentation

## 2022-05-15 DIAGNOSIS — Z3480 Encounter for supervision of other normal pregnancy, unspecified trimester: Secondary | ICD-10-CM | POA: Insufficient documentation

## 2022-05-15 MED ORDER — BLOOD PRESSURE KIT DEVI: 1.0000 | 0 refills | Status: AC

## 2022-05-15 NOTE — Progress Notes (Signed)
New OB Intake  I connected with [facility]  on 05/15/22 at  1:10 PM EST by In Person Visit and verified that I am speaking with the correct person using two identifiers. Nurse is located at [facility] and pt is located at [place].  I discussed the limitations, risks, security and privacy concerns of performing an evaluation and management service by telephone and the availability of in person appointments. I also discussed with the patient that there may be a patient responsible charge related to this service. The patient expressed understanding and agreed to proceed.  I explained I am completing New OB Intake today. We discussed EDD of 12/13/22 that is based on LMP of 03/08/22. Pt is G4/P0. I reviewed her allergies, medications, Medical/Surgical/OB history, and appropriate screenings. I informed her of [facility] services. [facility] information placed in AVS. Based on history, this is a low risk pregnancy.  Patient Active Problem List   Diagnosis Date Noted   Syncope     Concerns addressed today  Delivery Plans Plans to deliver at [facility]. Patient given information for [facility] Healthy Baby website for more information about Women's and [place]. Patient is interested in water birth. Offered upcoming OB visit with CNM to discuss further.  MyChart/Babyscripts MyChart access verified. I explained pt will have some visits in office and some virtually. Babyscripts instructions given and order placed. Patient verifies receipt of registration text/e-mail. Account successfully created and app downloaded.  Blood Pressure Cuff/Weight Scale Blood pressure cuff ordered for patient to pick-up from [employer]. Explained after first prenatal appt pt will check weekly and document in 32. Patient does not have weight scale; patient may purchase if they desire to track weight weekly in Babyscripts.  Anatomy US Explained first scheduled [place] will be around 19 weeks. Anatomy US scheduled for 19  wks at [facility]. Pt notified to arrive at [facility]BD.  Labs Discussed Johnsie Cancel genetic screening with patient. Would like both Panorama and Horizon drawn at new OB visit. Routine prenatal labs needed.  COVID Vaccine Patient has had COVID vaccine.   Is patient a CenteringPregnancy candidate?  Declined Declined due to Group setting Not a candidate due to  na  Social Determinants of Health Food Insecurity: Patient denies food insecurity. WIC Referral: Patient is interested in referral to [facility].  Transportation: Patient denies transportation needs. Childcare: Discussed no children allowed at ultrasound appointments. Offered childcare services; patient declines childcare services at this time.  Interested in [place]? If yes, send referral.   First visit review I reviewed new OB appt with patient. I explained they will have a provider visit that includes [facility] labs. Explained pt will be seen by Dr. Jodi Mourning at first visit; encounter routed to appropriate provider. Explained that patient will be seen by pregnancy navigator following visit with provider.   Penny Pia, RN 05/15/2022  1:05 PM

## 2022-05-16 LAB — CBC/D/PLT+RPR+RH+ABO+RUBIGG...
Antibody Screen: NEGATIVE
Basophils Absolute: 0 10*3/uL (ref 0.0–0.2)
Basos: 1 %
EOS (ABSOLUTE): 0.2 10*3/uL (ref 0.0–0.4)
Eos: 3 %
HCV Ab: NONREACTIVE
HIV Screen 4th Generation wRfx: NONREACTIVE
Hematocrit: 41.7 % (ref 34.0–46.6)
Hemoglobin: 13.6 g/dL (ref 11.1–15.9)
Hepatitis B Surface Ag: NEGATIVE
Immature Grans (Abs): 0 10*3/uL (ref 0.0–0.1)
Immature Granulocytes: 0 %
Lymphocytes Absolute: 1.8 10*3/uL (ref 0.7–3.1)
Lymphs: 27 %
MCH: 29.4 pg (ref 26.6–33.0)
MCHC: 32.6 g/dL (ref 31.5–35.7)
MCV: 90 fL (ref 79–97)
Monocytes Absolute: 0.4 10*3/uL (ref 0.1–0.9)
Monocytes: 6 %
Neutrophils Absolute: 4.1 10*3/uL (ref 1.4–7.0)
Neutrophils: 63 %
Platelets: 327 10*3/uL (ref 150–450)
RBC: 4.62 x10E6/uL (ref 3.77–5.28)
RDW: 12.6 % (ref 11.7–15.4)
RPR Ser Ql: NONREACTIVE
Rh Factor: POSITIVE
Rubella Antibodies, IGG: 5.86 index (ref >0.99)
WBC: 6.5 10*3/uL (ref 3.4–10.8)

## 2022-05-16 LAB — HCV INTERPRETATION

## 2022-05-18 ENCOUNTER — Other Ambulatory Visit: Payer: Self-pay

## 2022-05-18 ENCOUNTER — Inpatient Hospital Stay (HOSPITAL_COMMUNITY)
Admission: AD | Admit: 2022-05-18 | Discharge: 2022-05-18 | Disposition: A | Discharge status: Home / Self Care | Payer: Medicaid Other | Attending: Obstetrics and Gynecology | Admitting: Obstetrics and Gynecology

## 2022-05-18 ENCOUNTER — Encounter (HOSPITAL_COMMUNITY): Payer: Self-pay | Admitting: Obstetrics and Gynecology

## 2022-05-18 DIAGNOSIS — R109 Unspecified abdominal pain: Secondary | ICD-10-CM

## 2022-05-18 DIAGNOSIS — O26891 Other specified pregnancy related conditions, first trimester: Secondary | ICD-10-CM | POA: Diagnosis not present

## 2022-05-18 DIAGNOSIS — O09291 Supervision of pregnancy with other poor reproductive or obstetric history, first trimester: Secondary | ICD-10-CM | POA: Insufficient documentation

## 2022-05-18 DIAGNOSIS — Z8249 Family history of ischemic heart disease and other diseases of the circulatory system: Secondary | ICD-10-CM | POA: Diagnosis not present

## 2022-05-18 DIAGNOSIS — O10912 Unspecified pre-existing hypertension complicating pregnancy, second trimester: Secondary | ICD-10-CM | POA: Diagnosis not present

## 2022-05-18 DIAGNOSIS — Z3A10 10 weeks gestation of pregnancy: Secondary | ICD-10-CM | POA: Insufficient documentation

## 2022-05-18 DIAGNOSIS — M545 Low back pain, unspecified: Secondary | ICD-10-CM | POA: Insufficient documentation

## 2022-05-18 DIAGNOSIS — O209 Hemorrhage in early pregnancy, unspecified: Secondary | ICD-10-CM | POA: Diagnosis present

## 2022-05-18 DIAGNOSIS — O10919 Unspecified pre-existing hypertension complicating pregnancy, unspecified trimester: Secondary | ICD-10-CM | POA: Diagnosis present

## 2022-05-18 DIAGNOSIS — N96 Recurrent pregnancy loss: Secondary | ICD-10-CM

## 2022-05-18 LAB — URINALYSIS, ROUTINE W REFLEX MICROSCOPIC
Bilirubin Urine: NEGATIVE
Glucose, UA: NEGATIVE mg/dL
Hgb urine dipstick: NEGATIVE
Ketones, ur: NEGATIVE mg/dL
Leukocytes,Ua: NEGATIVE
Nitrite: NEGATIVE
Protein, ur: NEGATIVE mg/dL
Specific Gravity, Urine: 1.019 (ref 1.005–1.030)
pH: 6 (ref 5.0–8.0)

## 2022-05-18 MED ORDER — NIFEDIPINE ER OSMOTIC RELEASE 30 MG PO TB24: 30.0000 mg | ORAL_TABLET | Freq: Every day | ORAL | 1 refills | Status: DC

## 2022-05-18 MED ORDER — IBUPROFEN 600 MG PO TABS
600.0000 mg | ORAL_TABLET | Freq: Once | ORAL | Status: AC
  Administered 2022-05-18: 600 mg via ORAL
  Filled 2022-05-18: qty 1

## 2022-05-18 MED ORDER — ASPIRIN 81 MG PO TBEC: 81.0000 mg | DELAYED_RELEASE_TABLET | Freq: Every day | ORAL | 1 refills | Status: DC

## 2022-05-18 NOTE — MAU Note (Signed)
Marcia Hanson is a 42 y.o. at 10w1dhere in MAU reporting: was having some mild cramping last night and today, tried tylenol with no relief. About an hour ago had some more severe cramping and went to the bathroom and saw some heavy bleeding.  Onset of complaint: last night  Pain score: 8/10  Vitals:   05/18/22 1856  BP: (!) 140/77  Pulse: 84  Resp: 18  Temp: 98.4 F (36.9 C)  SpO2: 100%     FHT:180  Lab orders placed from triage: UA

## 2022-05-18 NOTE — MAU Provider Note (Signed)
History     CSN: [id number]  Arrival date and time: 05/18/22 1844   Event Date/Time   First Provider Initiated Contact with Patient 05/18/22 1925      Chief Complaint  Patient presents with   Abdominal Pain   Vaginal Bleeding   HPI  [name] is a 42 y.o. female G38P0030 @ 16w1dhere in MAU with complaints of vaginal bleeding. The bleeding is bright red, she is not actively bleeding now. No recent intercourse. This all started today. She had this problems at 8 weeks and the bleeding stropped spontaneously. 1st trimester [place]owed IUP with uterine fibroids.   All SAB's have been 12-15 weeks. She has not had any workup for recurrent pregnancy loss.   She reports lower abdomenl pain that radiates around to her lower back. She has tried tylenol which has not helped much.   Strong family HTN in both mother and father.   OB History     Gravida  4   Para  0   Term  0   Preterm  0   AB  3   Living  0      SAB  3   IAB  0   Ectopic  0   Multiple  0   Live Births  0           Past Medical History:  Diagnosis Date   Syncope     Past Surgical History:  Procedure Laterality Date   NO PAST SURGERIES      Family History  Problem Relation Age of Onset   Diabetes Maternal Grandmother    Hypertension Maternal Grandmother    Stroke Maternal Grandfather    Diabetes Maternal Grandfather    Hypertension Maternal Grandfather    Dementia Maternal Grandfather     Social History   Tobacco Use   Smoking status: Never   Smokeless tobacco: Never  Vaping Use   Vaping Use: Never used  Substance Use Topics   Alcohol use: No   Drug use: No    Allergies:  Allergies  Allergen Reactions   Avocado Rash    Medications Prior to Admission  Medication Sig Dispense Refill Last Dose   acetaminophen (TYLENOL) 500 MG tablet Take 500 mg by mouth every 6 (six) hours as needed.   05/18/2022   Prenatal Vit-Fe Fumarate-FA (PRENATAL VITAMINS PO) Take 1 tablet  by mouth daily.   05/18/2022   albuterol (VENTOLIN HFA) 108 (90 Base) MCG/ACT inhaler Inhale 2 puffs into the lungs every 6 (six) hours as needed for wheezing or shortness of breath (Cough). (Patient not taking: Reported on 05/15/2022) 18 g 0    ALPRAZolam (XANAX) 0.25 MG tablet Take 0.25 mg by mouth 3 (three) times daily as needed for anxiety.      Blood Pressure Monitoring (BLOOD PRESSURE KIT) DEVI 1 Device by Does not apply route once a week. 1 each 0    fluticasone (FLONASE) 50 MCG/ACT nasal spray Place 1 spray into both nostrils daily. (Patient not taking: Reported on 05/15/2022) 18 mL 0    ipratropium (ATROVENT) 0.06 % nasal spray Place 2 sprays into both nostrils 4 (four) times daily. As needed for nasal congestion, runny nose (Patient not taking: Reported on 05/15/2022) 15 mL 12    No results found for this or any previous visit (from the past 48 hour(s)).   Review of Systems  Gastrointestinal:  Positive for abdominal pain.  Genitourinary:  Positive for vaginal bleeding. Negative for vaginal discharge.  Physical Exam   Blood pressure 137/85, pulse 81, temperature 98.4 F (36.9 C), temperature source Oral, resp. rate 18, height 5' 5"$  (1.651 m), weight 62.9 kg, last menstrual period 03/08/2022, SpO2 100 %.  Patient Vitals for the past 24 hrs:  BP Temp Temp src Pulse Resp SpO2 Height Weight  05/18/22 1915 137/85 -- -- 81 -- -- -- --  05/18/22 1911 (!) 142/92 -- -- 76 -- -- -- --  05/18/22 1856 (!) 140/77 98.4 F (36.9 C) Oral 84 18 100 % -- --  05/18/22 1850 -- -- -- -- -- -- 5' 5"$  (1.651 m) 62.9 kg     Physical Exam Constitutional:      General: She is not in acute distress.    Appearance: She is well-developed. She is not ill-appearing, toxic-appearing or diaphoretic.  Eyes:     Pupils: Pupils are equal, round, and reactive to light.  Abdominal:     Tenderness: There is no abdominal tenderness.  Genitourinary:    Comments: Cervix closed, anterior. No blood noted on exam glove.   Exam by Noni Saupe, NP  Skin:    General: Skin is warm.  Neurological:     Mental Status: She is alert and oriented to person, place, and time.  Psychiatric:        Behavior: Behavior normal.    MAU Course  Procedures  Pt informed that the ultrasound is considered a limited OB ultrasound and is not intended to be a complete ultrasound exam.  Patient also informed that the ultrasound is not being completed with the intent of assessing for fetal or placental anomalies or any pelvic abnormalities.  Explained that the purpose of today's ultrasound is to assess for  viability.  Patient acknowledges the purpose of the exam and the limitations of the study.     Active fetus on [place] with + fetal heart rate.   MDM  O positive blood type Ibuprofen 600 mg given.   BP elevated in MAU today along with elevations noted in prior visits. Likely essential HTN.  Discussed initiation of nifedipine along with BASA. She will continue to keep BP log at home.  Pain and symptoms improved prior to DC home.   Assessment and Plan   A:  1. Abdominal pain during pregnancy in first trimester   2. Recurrent pregnancy loss   3. Chronic hypertension affecting pregnancy   4. [redacted] weeks gestation of pregnancy       P:  DC home If this pregnancy were to end in miscarriage/loss, highly recommend thrombophilia workup/ recurrent pregnancy loss.  Start BASA daily Rx: Nifedipine 30 mg XL Daily.  Continue BP log at home Keep your OB visits. Pelvic rest   Ivana Nicastro, Artist Pais, NP 05/18/2022 8:11 PM

## 2022-05-28 ENCOUNTER — Inpatient Hospital Stay (HOSPITAL_COMMUNITY)
Admission: AD | Admit: 2022-05-28 | Discharge: 2022-05-28 | Disposition: A | Discharge status: Home / Self Care | Payer: Medicaid Other | Attending: Obstetrics & Gynecology | Admitting: Obstetrics & Gynecology

## 2022-05-28 ENCOUNTER — Inpatient Hospital Stay (HOSPITAL_COMMUNITY): Payer: Medicaid Other

## 2022-05-28 ENCOUNTER — Encounter (HOSPITAL_COMMUNITY): Payer: Self-pay | Admitting: Obstetrics & Gynecology

## 2022-05-28 DIAGNOSIS — O039 Complete or unspecified spontaneous abortion without complication: Secondary | ICD-10-CM | POA: Diagnosis not present

## 2022-05-28 DIAGNOSIS — Z3A11 11 weeks gestation of pregnancy: Secondary | ICD-10-CM | POA: Diagnosis not present

## 2022-05-28 DIAGNOSIS — Z79899 Other long term (current) drug therapy: Secondary | ICD-10-CM | POA: Diagnosis not present

## 2022-05-28 DIAGNOSIS — O09521 Supervision of elderly multigravida, first trimester: Secondary | ICD-10-CM | POA: Insufficient documentation

## 2022-05-28 DIAGNOSIS — O10919 Unspecified pre-existing hypertension complicating pregnancy, unspecified trimester: Secondary | ICD-10-CM

## 2022-05-28 DIAGNOSIS — Z3480 Encounter for supervision of other normal pregnancy, unspecified trimester: Secondary | ICD-10-CM

## 2022-05-28 DIAGNOSIS — O209 Hemorrhage in early pregnancy, unspecified: Secondary | ICD-10-CM | POA: Diagnosis present

## 2022-05-28 LAB — HCG, QUANTITATIVE, PREGNANCY: hCG, Beta Chain, Quant, S: 11920 m[IU]/mL — ABNORMAL HIGH (ref <5)

## 2022-05-28 LAB — CBC
HCT: 30.4 % — ABNORMAL LOW (ref 36.0–46.0)
Hemoglobin: 10 g/dL — ABNORMAL LOW (ref 12.0–15.0)
MCH: 29.4 pg (ref 26.0–34.0)
MCHC: 32.9 g/dL (ref 30.0–36.0)
MCV: 89.4 fL (ref 80.0–100.0)
Platelets: 336 10*3/uL (ref 150–400)
RBC: 3.4 MIL/uL — ABNORMAL LOW (ref 3.87–5.11)
RDW: 12.5 % (ref 11.5–15.5)
WBC: 8.9 10*3/uL (ref 4.0–10.5)
nRBC: 0 % (ref 0.0–0.2)

## 2022-05-28 MED ORDER — OXYCODONE-ACETAMINOPHEN 5-325 MG PO TABS: 1.0000 | ORAL_TABLET | Freq: Four times a day (QID) | ORAL | 0 refills | Status: DC | PRN

## 2022-05-28 MED ORDER — ALPRAZOLAM 0.25 MG PO TABS
0.2500 mg | ORAL_TABLET | Freq: Once | ORAL | Status: AC
  Administered 2022-05-28: 0.25 mg via ORAL
  Filled 2022-05-28: qty 1

## 2022-05-28 MED ORDER — IBUPROFEN 800 MG PO TABS: 800.0000 mg | ORAL_TABLET | Freq: Three times a day (TID) | ORAL | 0 refills | Status: DC

## 2022-05-28 MED ORDER — OXYCODONE-ACETAMINOPHEN 5-325 MG PO TABS
2.0000 | ORAL_TABLET | Freq: Once | ORAL | Status: AC
  Administered 2022-05-28: 2 via ORAL
  Filled 2022-05-28: qty 2

## 2022-05-28 NOTE — MAU Provider Note (Signed)
History     CSN: [id number]  Arrival date and time: 05/28/22 0227   Event Date/Time   First Provider Initiated Contact with Patient 05/28/22 0326      Chief Complaint  Patient presents with   Vaginal Bleeding    Marcia Hanson is a 42 y.o. G4P0030 at 61w4dwho receives care at [facility].  She presents today for vaginal bleeding.  She states she has been having bleeding for the past 1-2 hours.  She reports cramping that started about 30 minutes prior to onset of bleeding.  She also reports passing lime sized clots. Patient rates her pain a 8/10 and reports it is located in the lower abdomen and back.  She states the pain is intermittent in nature.  Patient denies HA, dizziness, SOB, or rapid heart rate.  OB History     Gravida  4   Para  0   Term  0   Preterm  0   AB  3   Living  0      SAB  3   IAB  0   Ectopic  0   Multiple  0   Live Births  0           Past Medical History:  Diagnosis Date   Syncope     Past Surgical History:  Procedure Laterality Date   NO PAST SURGERIES      Family History  Problem Relation Age of Onset   Diabetes Maternal Grandmother    Hypertension Maternal Grandmother    Stroke Maternal Grandfather    Diabetes Maternal Grandfather    Hypertension Maternal Grandfather    Dementia Maternal Grandfather     Social History   Tobacco Use   Smoking status: Never   Smokeless tobacco: Never  Vaping Use   Vaping Use: Never used  Substance Use Topics   Alcohol use: No   Drug use: No    Allergies:  Allergies  Allergen Reactions   Avocado Rash    Medications Prior to Admission  Medication Sig Dispense Refill Last Dose   aspirin EC 81 MG tablet Take 1 tablet (81 mg total) by mouth daily. Swallow whole. 90 tablet 1 05/27/2022   NIFEdipine (PROCARDIA-XL/NIFEDICAL-XL) 30 MG 24 hr tablet Take 1 tablet (30 mg total) by mouth daily. 90 tablet 1 05/27/2022   Prenatal Vit-Fe Fumarate-FA (PRENATAL VITAMINS PO) Take 1  tablet by mouth daily.   05/27/2022   acetaminophen (TYLENOL) 500 MG tablet Take 500 mg by mouth every 6 (six) hours as needed.      albuterol (VENTOLIN HFA) 108 (90 Base) MCG/ACT inhaler Inhale 2 puffs into the lungs every 6 (six) hours as needed for wheezing or shortness of breath (Cough). (Patient not taking: Reported on 05/15/2022) 18 g 0    Blood Pressure Monitoring (BLOOD PRESSURE KIT) DEVI 1 Device by Does not apply route once a week. 1 each 0    fluticasone (FLONASE) 50 MCG/ACT nasal spray Place 1 spray into both nostrils daily. (Patient not taking: Reported on 05/15/2022) 18 mL 0    ipratropium (ATROVENT) 0.06 % nasal spray Place 2 sprays into both nostrils 4 (four) times daily. As needed for nasal congestion, runny nose (Patient not taking: Reported on 05/15/2022) 15 mL 12     Review of Systems  Genitourinary:  Positive for vaginal bleeding.   Physical Exam   Blood pressure (!) 147/94, pulse 82, temperature 97.8 F (36.6 C), resp. rate 18, height '5\' 5"'$  (1.651 m), weight 62.1  kg, last menstrual period 03/08/2022.  Physical Exam Constitutional:      Appearance: Normal appearance.  HENT:     Head: Normocephalic and atraumatic.  Eyes:     Conjunctiva/sclera: Conjunctivae normal.  Cardiovascular:     Rate and Rhythm: Normal rate.  Pulmonary:     Effort: Pulmonary effort is normal. No respiratory distress.  Abdominal:     Palpations: Abdomen is soft.     Tenderness: There is no abdominal tenderness.  Genitourinary:    Comments: VE deferred pt preference Musculoskeletal:     Cervical back: Normal range of motion.  Skin:    General: Skin is warm and dry.  Neurological:     Mental Status: She is alert and oriented to person, place, and time.  Psychiatric:        Mood and Affect: Mood normal.        Behavior: Behavior normal.     MAU Course  Procedures Results for orders placed or performed during the hospital encounter of 05/28/22 (from the past 24 hour(s))  CBC     Status:  Abnormal (Preliminary result)   Collection Time: 05/28/22  5:08 AM  Result Value Ref Range   WBC PENDING 4.0 - 10.5 K/uL   RBC 3.40 (L) 3.87 - 5.11 MIL/uL   Hemoglobin 10.0 (L) 12.0 - 15.0 g/dL   HCT 30.4 (L) 36.0 - 46.0 %   MCV 89.4 80.0 - 100.0 fL   MCH 29.4 26.0 - 34.0 pg   MCHC 32.9 30.0 - 36.0 g/dL   RDW 12.5 11.5 - 15.5 %   Platelets 336 150 - 400 K/uL   nRBC PENDING 0.0 - 0.2 %  ;lt  Patient informed that the ultrasound is considered a limited OB ultrasound and is not intended to be a complete ultrasound exam.  Patient also informed that the ultrasound is not being completed with the intent of assessing for fetal or placental anomalies or any pelvic abnormalities.  Explained that the purpose of today's ultrasound is to assess for  .  Patient acknowledges the purpose of the exam and the limitations of the study.  Patient informed no IUP identified which is suspicious for SAB.   US OB Transvaginal  Result Date: 05/28/2022 CLINICAL DATA:  Vaginal bleeding. History of recurrent spontaneous abortion. EXAM: TRANSVAGINAL OB ULTRASOUND TECHNIQUE: Transvaginal ultrasound was performed for complete evaluation of the gestation as well as the maternal uterus, adnexal regions, and pelvic cul-de-sac. COMPARISON:  05/04/2022 FINDINGS: Previously seen intrauterine sac with internal fetus has collapsed with no detected fetal parts. Trophoblastic type tissue is still present based on color Doppler imaging. Heterogeneity in the endometrial cavity is partially attributed to clot. No extra ovarian adnexal mass or pelvic fluid. IMPRESSION: Failed pregnancy when compared to 05/04/2022. 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 Electronically Signed   By: Jorje Guild M.D.   On: 05/28/2022 04:46    MDM Ultrasound Pain Medication Prescription Coordination of Follow-Up Assessment and Plan  42 year old, G4P0030  SIUP at 11.4 weeks Vaginal Bleeding H/O Recurrent  SAB  -Reviewed POC with patient. -Exam performed and findings discussed.  -Informed that BSUS not showing anticipated gestation at 11 weeks. -Discussed sending for formal US. -Patient offered and accepts pain medication.  -Percocet tablets ordered. -Await results.    Maryann Conners 05/28/2022, 3:26 AM   Reassessment (5:00 AM) -Results as above. -Labs ordered as they were overlooked and not ordered during initial assessment.  -Dr.  Owens Shark contacted for clarification of trophoblastic tissue and his assessment of whether normal or concern for carcinoma process.  States normal placental tissue.  -Provider to bedside to discuss results with patient. -Condolences given. -Patient states she feels she is having an anxiety attack.  Reports history of Xanax usage.  Review of chart confirms usage of 0.'25mg'$  TID in past. 0.'25mg'$  ordered.  -Reviewed management of remaining products including expectant management, cytotec, or surgical intervention.  -Patient reports doing expectant management in the past, but encouraged to consider all options while labs pending. -No questions.   Reassessment (5:43 AM) -HgB returns at 10.0. -Dr. Eddie North informed of patient status, evaluation, interventions, and results. No new orders or interventions recommended.  -Provider to bedside to discuss. -Patient continues to desire expectant mgmt. -Reviewed return precautions including dizziness, SOB, tachycardia, increased bleeding, and/or syncope.  -Patient verbalizes understanding. -Requests medication for pain. Will send in ibuprofen and limited script for percocet. -Message sent to [facility] for follow up appt in 1-2 weeks.  -Encouraged to call primary office or return to MAU if symptoms worsen or with the onset of new symptoms. -Discharged to home in stable condition.  Maryann Conners MSN, CNM Advanced Practice Provider, Center for [employer]

## 2022-05-28 NOTE — MAU Note (Signed)
.  Marcia Hanson is a 42 y.o. at 6w4dhere in MAU reporting: heavy bleeding and cramping that started 1 hour ago.  LMP:  Onset of complaint: 1 hour Pain score: 9 Vitals:   05/28/22 0241  BP: (!) 147/94  Pulse: 82  Resp: 18  Temp: 97.8 F (36.6 C)     [id number] hear with dopler Lab orders placed from triage:  u/a

## 2022-06-01 ENCOUNTER — Encounter (HOSPITAL_COMMUNITY): Payer: Self-pay | Admitting: *Deleted

## 2022-06-01 ENCOUNTER — Other Ambulatory Visit: Payer: Self-pay

## 2022-06-01 ENCOUNTER — Emergency Department (HOSPITAL_COMMUNITY)
Admission: EM | Admit: 2022-06-01 | Discharge: 2022-06-01 | Disposition: A | Discharge status: Home / Self Care | Payer: Medicaid Other | Attending: Emergency Medicine | Admitting: Emergency Medicine

## 2022-06-01 DIAGNOSIS — R55 Syncope and collapse: Secondary | ICD-10-CM | POA: Diagnosis present

## 2022-06-01 LAB — CBC WITH DIFFERENTIAL/PLATELET
Abs Immature Granulocytes: 0.02 10*3/uL (ref 0.00–0.07)
Basophils Absolute: 0 10*3/uL (ref 0.0–0.1)
Basophils Relative: 1 %
Eosinophils Absolute: 0 10*3/uL (ref 0.0–0.5)
Eosinophils Relative: 1 %
HCT: 28.9 % — ABNORMAL LOW (ref 36.0–46.0)
Hemoglobin: 9.7 g/dL — ABNORMAL LOW (ref 12.0–15.0)
Immature Granulocytes: 0 %
Lymphocytes Relative: 25 %
Lymphs Abs: 1.5 10*3/uL (ref 0.7–4.0)
MCH: 30.3 pg (ref 26.0–34.0)
MCHC: 33.6 g/dL (ref 30.0–36.0)
MCV: 90.3 fL (ref 80.0–100.0)
Monocytes Absolute: 0.3 10*3/uL (ref 0.1–1.0)
Monocytes Relative: 6 %
Neutro Abs: 4.1 10*3/uL (ref 1.7–7.7)
Neutrophils Relative %: 67 %
Platelets: 393 10*3/uL (ref 150–400)
RBC: 3.2 MIL/uL — ABNORMAL LOW (ref 3.87–5.11)
RDW: 12.7 % (ref 11.5–15.5)
WBC: 6.1 10*3/uL (ref 4.0–10.5)
nRBC: 0 % (ref 0.0–0.2)

## 2022-06-01 LAB — BASIC METABOLIC PANEL
Anion gap: 12 (ref 5–15)
BUN: <5 mg/dL — ABNORMAL LOW (ref 6–20)
CO2: 21 mmol/L — ABNORMAL LOW (ref 22–32)
Calcium: 8.9 mg/dL (ref 8.9–10.3)
Chloride: 106 mmol/L (ref 98–111)
Creatinine, Ser: 0.39 mg/dL — ABNORMAL LOW (ref 0.44–1.00)
GFR, Estimated: >60 mL/min (ref >60)
Glucose, Bld: 108 mg/dL — ABNORMAL HIGH (ref 70–99)
Potassium: 3.4 mmol/L — ABNORMAL LOW (ref 3.5–5.1)
Sodium: 139 mmol/L (ref 135–145)

## 2022-06-01 LAB — HCG, QUANTITATIVE, PREGNANCY: hCG, Beta Chain, Quant, S: 8756 m[IU]/mL — ABNORMAL HIGH (ref <5)

## 2022-06-01 MED ORDER — SODIUM CHLORIDE 0.9 % IV BOLUS
1000.0000 mL | Freq: Once | INTRAVENOUS | Status: AC
  Administered 2022-06-01: 1000 mL via INTRAVENOUS

## 2022-06-01 MED ORDER — ACETAMINOPHEN 500 MG PO TABS
1000.0000 mg | ORAL_TABLET | Freq: Once | ORAL | Status: AC
  Administered 2022-06-01: 1000 mg via ORAL
  Filled 2022-06-01: qty 2

## 2022-06-01 NOTE — ED Triage Notes (Addendum)
Patient reported to have a miscarriage on Saturday night.  She was seen at [facility] on Sunday for same.  She has had ongoing vaginal bleeding but yesterday/tonight she has saturated 4 pads.  Patient had reported syncope episode at 0300 when she got up to use the bathroom.  She did fall and hit her face on the right side.  She has pain here and pain in her lower abdomen.  Patient was initially confused but she is now alert and oriented, skin is warm and dry.  Patient was taking bp medications for pre-eclampsia but this was stopped on Saturday.

## 2022-06-01 NOTE — ED Provider Notes (Signed)
[facility] Provider Note   CSN: [id number] Arrival date & time: 06/01/22  0347     History  Chief Complaint  Patient presents with   Loss of Consciousness    Marcia Hanson is a 42 y.o. female.  Patient presents to the emergency department for evaluation after a syncopal episode.  Patient reports that this occurred while she was in the bathroom.  She had precursor onset of nausea and felt like she was going to vomit.  The next thing she knew she woke up on the ground.  Patient reports that she has had similar syncopal episodes in the past.  She has been to neurology and cardiology for this and was told that it was related to her blood pressure.  She has some pain on the right side of her head after the fall.  No neck or back pain.       Home Medications Prior to Admission medications   Medication Sig Start Date End Date Taking? Authorizing Provider  acetaminophen (TYLENOL) 500 MG tablet Take 500 mg by mouth every 6 (six) hours as needed.    [provider]  albuterol (VENTOLIN HFA) 108 (90 Base) MCG/ACT inhaler Inhale 2 puffs into the lungs every 6 (six) hours as needed for wheezing or shortness of breath (Cough). Patient not taking: Reported on 05/15/2022 03/13/21   Lynden Oxford Scales, PA-C  Blood Pressure Monitoring (BLOOD PRESSURE KIT) DEVI 1 Device by Does not apply route once a week. 05/15/22   Constant, Peggy, MD  fluticasone (FLONASE) 50 MCG/ACT nasal spray Place 1 spray into both nostrils daily. Patient not taking: Reported on 05/15/2022 03/13/21   Lynden Oxford Scales, PA-C  ibuprofen (ADVIL) 800 MG tablet Take 1 tablet (800 mg total) by mouth 3 (three) times daily. 05/28/22   Gavin Pound, CNM  ipratropium (ATROVENT) 0.06 % nasal spray Place 2 sprays into both nostrils 4 (four) times daily. As needed for nasal congestion, runny nose Patient not taking: Reported on 05/15/2022 03/13/21   Lynden Oxford Scales, PA-C   NIFEdipine (PROCARDIA-XL/NIFEDICAL-XL) 30 MG 24 hr tablet Take 1 tablet (30 mg total) by mouth daily. 05/18/22 11/14/22  Rasch, Anderson Malta I, NP  oxyCODONE-acetaminophen (PERCOCET/ROXICET) 5-325 MG tablet Take 1-2 tablets by mouth every 6 (six) hours as needed for severe pain. 05/28/22   Gavin Pound, CNM  Prenatal Vit-Fe Fumarate-FA (PRENATAL VITAMINS PO) Take 1 tablet by mouth daily.    [provider]      Allergies    Avocado    Review of Systems   Review of Systems  Physical Exam Updated Vital Signs BP 120/82   Pulse 81   Temp 99 F (37.2 C) (Oral)   Resp 14   LMP 03/08/2022   SpO2 100%  Physical Exam Vitals and nursing note reviewed.  Constitutional:      General: She is not in acute distress.    Appearance: She is well-developed.  HENT:     Head: Normocephalic and atraumatic.     Mouth/Throat:     Mouth: Mucous membranes are moist.  Eyes:     General: Vision grossly intact. Gaze aligned appropriately.     Extraocular Movements: Extraocular movements intact.     Conjunctiva/sclera: Conjunctivae normal.  Cardiovascular:     Rate and Rhythm: Normal rate and regular rhythm.     Pulses: Normal pulses.     Heart sounds: Normal heart sounds, S1 normal and S2 normal. No murmur heard.  No friction rub. No gallop.  Pulmonary:     Effort: Pulmonary effort is normal. No respiratory distress.     Breath sounds: Normal breath sounds.  Abdominal:     General: Bowel sounds are normal.     Palpations: Abdomen is soft.     Tenderness: There is no abdominal tenderness. There is no guarding or rebound.     Hernia: No hernia is present.  Musculoskeletal:        General: No swelling.     Cervical back: Full passive range of motion without pain, normal range of motion and neck supple. No spinous process tenderness or muscular tenderness. Normal range of motion.     Right lower leg: No edema.     Left lower leg: No edema.  Skin:    General: Skin is warm and dry.      Capillary Refill: Capillary refill takes less than 2 seconds.     Findings: No ecchymosis, erythema, rash or wound.  Neurological:     General: No focal deficit present.     Mental Status: She is alert and oriented to person, place, and time.     GCS: GCS eye subscore is 4. GCS verbal subscore is 5. GCS motor subscore is 6.     Cranial Nerves: Cranial nerves 2-12 are intact.     Sensory: Sensation is intact.     Motor: Motor function is intact.     Coordination: Coordination is intact.  Psychiatric:        Attention and Perception: Attention normal.        Mood and Affect: Mood normal.        Speech: Speech normal.        Behavior: Behavior normal.     ED Results / Procedures / Treatments   Labs (all labs ordered are listed, but only abnormal results are displayed) Labs Reviewed  CBC WITH DIFFERENTIAL/PLATELET - Abnormal; Notable for the following components:      Result Value   RBC 3.20 (*)    Hemoglobin 9.7 (*)    HCT 28.9 (*)    All other components within normal limits  BASIC METABOLIC PANEL - Abnormal; Notable for the following components:   Potassium 3.4 (*)    CO2 21 (*)    Glucose, Bld 108 (*)    BUN <5 (*)    Creatinine, Ser 0.39 (*)    All other components within normal limits  HCG, QUANTITATIVE, PREGNANCY - Abnormal; Notable for the following components:   hCG, Beta Chain, Quant, S 8,756 (*)    All other components within normal limits    EKG EKG Interpretation  Date/Time:  Thursday June 01 2022 04:26:03 EST Ventricular Rate:  78 PR Interval:  182 QRS Duration: 83 QT Interval:  370 QTC Calculation: 422 R Axis:   49 Text Interpretation: Sinus rhythm Normal ECG Confirmed by Orpah Greek [phone]) on 06/01/2022 5:52:30 AM  Radiology No results found.  Procedures Procedures    Medications Ordered in ED Medications  sodium chloride 0.9 % bolus 1,000 mL (1,000 mLs Intravenous New Bag/Given 06/01/22 0438)  acetaminophen (TYLENOL) tablet  1,000 mg (1,000 mg Oral Given 06/01/22 [id number])    ED Course/ Medical Decision Making/ A&P                             Medical Decision Making Amount and/or Complexity of Data Reviewed External Data Reviewed: labs, radiology and notes. Labs:  ordered. Decision-making details documented in ED Course. ECG/medicine tests: ordered and independent interpretation performed. Decision-making details documented in ED Course.  Risk OTC drugs.   Differential diagnosis is considered to include, but not limited to: Arrhythmia; MI; vasovagal episode; toxidrome; PE; stroke  Patient presents to the emerged department for evaluation of syncope.  Patient reports that she has a history of syncope in the past that is similar to this.  She did have a prodrome of nausea and feeling poorly before she passed out.  This is reassuring.  In the past she has been fully worked up and told that her syncopal episodes are related to dropping blood pressure which seems likely in this case.  I did review her records, however, and she recently had a spontaneous miscarriage.  She had an ultrasound on February 18.  She continues to have some bleeding but no increased bleeding, no increasing abdominal pain.  She does not have any purulent discharge.  Beta-hCG is continuing to go down.  She appears well.  I did discuss with her the reasons to come back to the ED including fever and signs of infection.  She expresses understanding, has follow-up with her OB/GYN already scheduled.        Final Clinical Impression(s) / ED Diagnoses Final diagnoses:  Syncope, unspecified syncope type    Rx / DC Orders ED Discharge Orders     None         Nat Lowenthal, Gwenyth Allegra, MD 06/01/22 [phone]

## 2022-06-09 ENCOUNTER — Encounter: Payer: Medicaid Other | Admitting: Obstetrics

## 2022-06-12 ENCOUNTER — Encounter: Payer: Self-pay | Admitting: Obstetrics and Gynecology

## 2022-06-12 ENCOUNTER — Ambulatory Visit: Payer: Medicaid Other | Admitting: Obstetrics and Gynecology

## 2022-06-12 VITALS — BP 119/78 | HR 71 | Ht 65.0 in | Wt 133.0 lb

## 2022-06-12 DIAGNOSIS — N96 Recurrent pregnancy loss: Secondary | ICD-10-CM

## 2022-06-12 NOTE — Progress Notes (Signed)
Pt presents for SAB f/u. SAB on 2/18 at 40w4dReports continued spotting, daily headaches with normal blood pressures at home.   Wants to discuss recurrent miscarriage, next steps.

## 2022-06-12 NOTE — Progress Notes (Signed)
42 yo P0040 diagnosed with a spontaneous miscarriage at 11 weeks on 05/28/22 presenting for follow up. Patient reports persistent vaginal bleeding consisting of vaginal spotting. She denies pelvic pain or abnormal discharge. She is without any complaints. She is hoping for testing to explain recurrent miscarriage. She had 3 miscarriages with her last partner and this is the first with her current partner. They are both healthy. Neither of her previous partners had fathered a child.  Past Medical History:  Diagnosis Date   Syncope    Past Surgical History:  Procedure Laterality Date   NO PAST SURGERIES     Family History  Problem Relation Age of Onset   Diabetes Maternal Grandmother    Hypertension Maternal Grandmother    Stroke Maternal Grandfather    Diabetes Maternal Grandfather    Hypertension Maternal Grandfather    Dementia Maternal Grandfather    Social History   Tobacco Use   Smoking status: Never   Smokeless tobacco: Never  Vaping Use   Vaping Use: Never used  Substance Use Topics   Alcohol use: No   Drug use: No   ROS See pertinent in HPI. All other systems reviewed and non contributory Blood pressure 119/78, pulse 71, height '5\' 5"'$  (1.651 m), weight 133 lb (60.3 kg), last menstrual period 03/08/2022, unknown if currently breastfeeding. GENERAL: Well-developed, well-nourished female in no acute distress.  EXTREMITIES: No cyanosis, clubbing, or edema, 2+ distal pulses.  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

## 2022-06-14 LAB — BETA HCG QUANT (REF LAB): hCG Quant: 805 m[IU]/mL

## 2022-06-14 LAB — BETA-2-GLYCOPROTEIN I ABS, IGG/M/A
Beta-2 Glyco 1 IgA: 10 GPI IgA units (ref 0–25)
Beta-2 Glyco 1 IgM: <9 GPI IgM units (ref 0–32)
Beta-2 Glyco I IgG: <9 GPI IgG units (ref 0–20)

## 2022-06-14 LAB — CARDIOLIPIN ANTIBODIES, IGM+IGG
Anticardiolipin IgG: <9 GPL U/mL (ref 0–14)
Anticardiolipin IgM: <9 MPL U/mL (ref 0–12)

## 2022-06-14 LAB — HEMOGLOBIN A1C
Est. average glucose Bld gHb Est-mCnc: 114 mg/dL
Hgb A1c MFr Bld: 5.6 % (ref 4.8–5.6)

## 2022-06-14 LAB — LUPUS ANTICOAGULANT PANEL
Dilute Viper Venom Time: 35.1 s (ref 0.0–47.0)
PTT Lupus Anticoagulant: 32.8 s (ref 0.0–43.5)

## 2022-06-14 LAB — TSH: TSH: 0.371 u[IU]/mL — ABNORMAL LOW (ref 0.450–4.500)

## 2022-06-14 NOTE — Addendum Note (Signed)
Addended by: Mora Bellman on: 06/14/2022 02:24 PM   Modules accepted: Orders

## 2022-06-16 ENCOUNTER — Emergency Department (HOSPITAL_COMMUNITY): Payer: Medicaid Other

## 2022-06-16 ENCOUNTER — Emergency Department (HOSPITAL_COMMUNITY)
Admission: EM | Admit: 2022-06-16 | Discharge: 2022-06-17 | Disposition: A | Discharge status: Home / Self Care | Payer: Medicaid Other | Attending: Emergency Medicine | Admitting: Emergency Medicine

## 2022-06-16 ENCOUNTER — Encounter (HOSPITAL_COMMUNITY): Payer: Self-pay

## 2022-06-16 DIAGNOSIS — D509 Iron deficiency anemia, unspecified: Secondary | ICD-10-CM | POA: Insufficient documentation

## 2022-06-16 DIAGNOSIS — M542 Cervicalgia: Secondary | ICD-10-CM | POA: Diagnosis not present

## 2022-06-16 DIAGNOSIS — M25522 Pain in left elbow: Secondary | ICD-10-CM | POA: Diagnosis not present

## 2022-06-16 DIAGNOSIS — S0990XA Unspecified injury of head, initial encounter: Secondary | ICD-10-CM | POA: Diagnosis present

## 2022-06-16 LAB — CBC WITH DIFFERENTIAL/PLATELET
Abs Immature Granulocytes: 0 10*3/uL (ref 0.00–0.07)
Basophils Absolute: 0 10*3/uL (ref 0.0–0.1)
Basophils Relative: 1 %
Eosinophils Absolute: 0.3 10*3/uL (ref 0.0–0.5)
Eosinophils Relative: 6 %
HCT: 27.8 % — ABNORMAL LOW (ref 36.0–46.0)
Hemoglobin: 8.7 g/dL — ABNORMAL LOW (ref 12.0–15.0)
Immature Granulocytes: 0 %
Lymphocytes Relative: 38 %
Lymphs Abs: 1.9 10*3/uL (ref 0.7–4.0)
MCH: 27.8 pg (ref 26.0–34.0)
MCHC: 31.3 g/dL (ref 30.0–36.0)
MCV: 88.8 fL (ref 80.0–100.0)
Monocytes Absolute: 0.4 10*3/uL (ref 0.1–1.0)
Monocytes Relative: 9 %
Neutro Abs: 2.4 10*3/uL (ref 1.7–7.7)
Neutrophils Relative %: 46 %
Platelets: 330 10*3/uL (ref 150–400)
RBC: 3.13 MIL/uL — ABNORMAL LOW (ref 3.87–5.11)
RDW: 13.3 % (ref 11.5–15.5)
WBC: 5.1 10*3/uL (ref 4.0–10.5)
nRBC: 0 % (ref 0.0–0.2)

## 2022-06-16 LAB — BASIC METABOLIC PANEL
Anion gap: 7 (ref 5–15)
BUN: 7 mg/dL (ref 6–20)
CO2: 22 mmol/L (ref 22–32)
Calcium: 8.5 mg/dL — ABNORMAL LOW (ref 8.9–10.3)
Chloride: 107 mmol/L (ref 98–111)
Creatinine, Ser: 0.66 mg/dL (ref 0.44–1.00)
GFR, Estimated: >60 mL/min (ref >60)
Glucose, Bld: 91 mg/dL (ref 70–99)
Potassium: 3.4 mmol/L — ABNORMAL LOW (ref 3.5–5.1)
Sodium: 136 mmol/L (ref 135–145)

## 2022-06-16 MED ORDER — OXYCODONE-ACETAMINOPHEN 5-325 MG PO TABS
2.0000 | ORAL_TABLET | Freq: Once | ORAL | Status: AC
  Administered 2022-06-16: 2 via ORAL
  Filled 2022-06-16: qty 2

## 2022-06-16 MED ORDER — METOCLOPRAMIDE HCL 5 MG/ML IJ SOLN
10.0000 mg | Freq: Once | INTRAMUSCULAR | Status: AC
  Administered 2022-06-16: 10 mg via INTRAVENOUS
  Filled 2022-06-16: qty 2

## 2022-06-16 MED ORDER — SODIUM CHLORIDE 0.9 % IV BOLUS
1000.0000 mL | Freq: Once | INTRAVENOUS | Status: AC
  Administered 2022-06-16: 1000 mL via INTRAVENOUS

## 2022-06-16 MED ORDER — KETOROLAC TROMETHAMINE 30 MG/ML IJ SOLN
30.0000 mg | Freq: Once | INTRAMUSCULAR | Status: AC
  Administered 2022-06-16: 30 mg via INTRAVENOUS
  Filled 2022-06-16: qty 1

## 2022-06-16 MED ORDER — DIPHENHYDRAMINE HCL 50 MG/ML IJ SOLN
25.0000 mg | Freq: Once | INTRAMUSCULAR | Status: AC
  Administered 2022-06-16: 25 mg via INTRAVENOUS
  Filled 2022-06-16: qty 1

## 2022-06-16 MED ORDER — ONDANSETRON HCL 4 MG/2ML IJ SOLN
4.0000 mg | Freq: Once | INTRAMUSCULAR | Status: AC
  Administered 2022-06-16: 4 mg via INTRAVENOUS
  Filled 2022-06-16: qty 2
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
## 2022-06-17 ENCOUNTER — Other Ambulatory Visit: Payer: Self-pay

## 2022-06-17 LAB — PREGNANCY, URINE: Preg Test, Ur: POSITIVE — AB

## 2022-06-17 MED ORDER — IBUPROFEN 600 MG PO TABS: 600.0000 mg | ORAL_TABLET | Freq: Four times a day (QID) | ORAL | 0 refills | Status: DC | PRN

## 2022-06-17 MED ORDER — CYCLOBENZAPRINE HCL 10 MG PO TABS: 10.0000 mg | ORAL_TABLET | Freq: Three times a day (TID) | ORAL | 0 refills | Status: DC | PRN

## 2022-06-21 ENCOUNTER — Ambulatory Visit: Payer: Medicaid Other

## 2022-06-21 ENCOUNTER — Other Ambulatory Visit: Payer: Self-pay

## 2022-06-21 ENCOUNTER — Emergency Department (HOSPITAL_COMMUNITY)
Admission: EM | Admit: 2022-06-21 | Discharge: 2022-06-21 | Disposition: A | Discharge status: Home / Self Care | Payer: Medicaid Other | Attending: Emergency Medicine | Admitting: Emergency Medicine

## 2022-06-21 DIAGNOSIS — F0781 Postconcussional syndrome: Secondary | ICD-10-CM | POA: Diagnosis not present

## 2022-06-21 DIAGNOSIS — R519 Headache, unspecified: Secondary | ICD-10-CM | POA: Insufficient documentation

## 2022-06-21 LAB — CBC
HCT: 28.2 % — ABNORMAL LOW (ref 36.0–46.0)
Hemoglobin: 8.8 g/dL — ABNORMAL LOW (ref 12.0–15.0)
MCH: 27.2 pg (ref 26.0–34.0)
MCHC: 31.2 g/dL (ref 30.0–36.0)
MCV: 87.3 fL (ref 80.0–100.0)
Platelets: 263 10*3/uL (ref 150–400)
RBC: 3.23 MIL/uL — ABNORMAL LOW (ref 3.87–5.11)
RDW: 13.6 % (ref 11.5–15.5)
WBC: 5.4 10*3/uL (ref 4.0–10.5)
nRBC: 0 % (ref 0.0–0.2)

## 2022-06-21 LAB — BASIC METABOLIC PANEL
Anion gap: 8 (ref 5–15)
BUN: 10 mg/dL (ref 6–20)
CO2: 24 mmol/L (ref 22–32)
Calcium: 8.7 mg/dL — ABNORMAL LOW (ref 8.9–10.3)
Chloride: 105 mmol/L (ref 98–111)
Creatinine, Ser: 0.69 mg/dL (ref 0.44–1.00)
GFR, Estimated: >60 mL/min (ref >60)
Glucose, Bld: 88 mg/dL (ref 70–99)
Potassium: 3.5 mmol/L (ref 3.5–5.1)
Sodium: 137 mmol/L (ref 135–145)

## 2022-06-21 LAB — I-STAT BETA HCG BLOOD, ED (MC, WL, AP ONLY): I-stat hCG, quantitative: 179.6 m[IU]/mL — ABNORMAL HIGH (ref <5)

## 2022-06-21 MED ORDER — DIPHENHYDRAMINE HCL 50 MG/ML IJ SOLN
12.5000 mg | Freq: Once | INTRAMUSCULAR | Status: AC
  Administered 2022-06-21: 12.5 mg via INTRAVENOUS
  Filled 2022-06-21: qty 1

## 2022-06-21 MED ORDER — KETOROLAC TROMETHAMINE 15 MG/ML IJ SOLN
15.0000 mg | Freq: Once | INTRAMUSCULAR | Status: AC
  Administered 2022-06-21: 15 mg via INTRAVENOUS
  Filled 2022-06-21: qty 1

## 2022-06-21 MED ORDER — ONDANSETRON HCL 4 MG/2ML IJ SOLN
4.0000 mg | Freq: Once | INTRAMUSCULAR | Status: AC
  Administered 2022-06-21: 4 mg via INTRAVENOUS
  Filled 2022-06-21: qty 2

## 2022-06-21 MED ORDER — SODIUM CHLORIDE 0.9 % IV BOLUS
1000.0000 mL | Freq: Once | INTRAVENOUS | Status: AC
  Administered 2022-06-21: 1000 mL via INTRAVENOUS

## 2022-06-21 MED ORDER — ONDANSETRON HCL 4 MG PO TABS: 4.0000 mg | ORAL_TABLET | Freq: Four times a day (QID) | ORAL | 0 refills | Status: DC
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
## 2022-06-21 NOTE — Discharge Instructions (Addendum)
You were seen in the emergency department today for headache, nausea.  Your symptoms are most consistent with a concussion.  I am prescribing you Zofran which you can use every 6 hours for nausea.  Please avoid triggers that make her headache worse.  I have also provided you with information to [facility] which has a concussion clinic if you have ongoing symptoms.  Please return to emergency department for significantly worsening symptoms, confusion, severe headache that is uncontrolled, or symptoms of stroke.
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
## 2022-06-23 ENCOUNTER — Other Ambulatory Visit: Payer: Medicaid Other

## 2022-06-23 ENCOUNTER — Ambulatory Visit (HOSPITAL_BASED_OUTPATIENT_CLINIC_OR_DEPARTMENT_OTHER): Payer: Medicaid Other

## 2022-06-23 ENCOUNTER — Ambulatory Visit: Payer: Medicaid Other

## 2022-06-23 DIAGNOSIS — N96 Recurrent pregnancy loss: Secondary | ICD-10-CM

## 2022-06-23 DIAGNOSIS — R7989 Other specified abnormal findings of blood chemistry: Secondary | ICD-10-CM
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
## 2022-06-23 NOTE — Progress Notes (Signed)
Pt RTC. Advised of abnormal TSH and need for additional labs. Pt verbalized understanding and appreciation of multiple attempts to reach her. Call transferred to schedulers.

## 2022-06-24 LAB — T3, FREE: T3, Free: 3.7 pg/mL (ref 2.0–4.4)

## 2022-06-24 LAB — T4, FREE: Free T4: 1.36 ng/dL (ref 0.82–1.77)

## 2022-06-26 ENCOUNTER — Ambulatory Visit: Payer: Medicaid Other | Admitting: Family Medicine

## 2022-06-26 ENCOUNTER — Encounter: Payer: Self-pay | Admitting: Family Medicine

## 2022-06-26 VITALS — BP 130/84 | HR 87 | Ht 65.0 in | Wt 131.2 lb

## 2022-06-26 DIAGNOSIS — M542 Cervicalgia: Secondary | ICD-10-CM | POA: Diagnosis not present

## 2022-06-26 DIAGNOSIS — S060X0A Concussion without loss of consciousness, initial encounter: Secondary | ICD-10-CM

## 2022-06-26 MED ORDER — NORTRIPTYLINE HCL 25 MG PO CAPS: 25.0000 mg | ORAL_CAPSULE | Freq: Every day | ORAL | 2 refills | Status: DC

## 2022-06-26 NOTE — Progress Notes (Signed)
[facility] at [facility] [address], [address] Phone:  [phone]   Fax:  [phone]    Name: Marcia Hanson Indication: Recurrent Pregnancy Loss (RPL)  DOB: [birth year] Age: 42 y.o.   EDD: Not currently pregnant LMP: Did not ask Referring Provider:  Mora Bellman, MD  EGA: Not currently pregnant Genetic Counselor: Staci Righter, MS, CGC  OB [id number] Date of Appointment: 06/23/2022  Accompanied by: Kristine Linea Face to Face Time: 25 Minutes   Pregnancy/Family History: A pedigree was created and scanned into Epic under the Media tab. Marcia Hanson has had four spontaneous pregnancy losses. No genetic testing has been performed on Marcia Hanson's losses to genetic counseling's knowledge.  Marcia Hanson reports she and her partner have African American ancestry.  Denies Ashkenazi Jewish ancestry. Family history not remarkable for consanguinity, individuals with birth defects, intellectual disability, autism spectrum disorder, still births, or unexplained neonatal death.     Genetic Counseling:   Recurrent Pregnancy Loss. Recurrent Pregnancy Loss (RPL) is defined as three or more clinically recognized consecutive or non-consecutive pregnancy losses occurring prior to fetal viability (<[redacted] weeks gestation). Pregnancy losses occur for many reasons, including endocrine factors, environmental agents, immunologic causes, maternal factors, as well as chromosomal and single-gene disorders. The overall rate of pregnancy loss is approximately 20%, with the majority occurring in the first trimester. Chromosomal causes account for 50-70% of first trimester losses but are less commonly cited as a cause for late gestational pregnancy loss. Most chromosome abnormalities are a result of a new change in the sperm or egg that conceived the fetus; however, in 3-5% of all couples with RPL one member of the couple is found to have a balanced chromosome rearrangement,  called a translocation. A translocation occurs when there is an exchange in chromosome material, such as when a segment of one chromosome breaks off and reattaches to a different chromosome. When the translocation is balanced, there is no extra or missing genetic material; however, an individual who carries a balanced translocation has an increased risk to pass unbalanced chromosomes to their offspring. In couples in which one partner is a carrier of a balanced translocation, this can result in an increased risk for infertility, RPL, and offspring with abnormal chromosomes. Due to this couple's history of RPL, chromosome analysis was offered. This testing may help to inform the cause of the previous pregnancy losses, and may help the couple make informed reproductive decisions moving forward.  Expanded Carrier screening. We discussed that expanded carrier screening (ECS) is a testing option available that evaluates for a wide range of genetic conditions. Some of these conditions are severe and actionable, but also rare; others occur more commonly, but are less severe. We discussed that testing panels could include more than 500 autosomal recessive or X-linked genetic conditions. In the event that one partner were found to be a carrier for one or more conditions, carrier screening would be available to the other partner for those conditions. This could help determine whether any future pregnancies are at increased risk for a particular recessive or X-linked genetic condition. We discussed the risks, benefits, and limitations of carrier screening.      Patient Plan:  Proceed with: Marcia Hanson accepted chromosome analysis given her history of recurrent pregnancy loss and her blood was drawn today.  Informed consent was obtained. All questions were answered.  Declined: Marcia Hanson shared that she would like to consider the option of expanded carrier screening and will contact genetic counseling in  the [facility] if she desires to pursue this screening.    Thank you for sharing in the care of [place] with [place].  Please do not hesitate to contact us if you have any questions.  Staci Righter, MS, [facility]

## 2022-06-26 NOTE — Patient Instructions (Signed)
Thank you for coming in today.   I've referred you to Physical Therapy.  Let us know if you don't hear from them in one week.   Start nortriptyline at bedtime.   Recheck in 2 weeks.   Let me know if this is not working.   I am out of the office next week.   Heating pad will help some.   TENS UNIT: This is helpful for muscle pain and spasm.   Search and Purchase a TENS 7000 2nd edition at  www.tenspros.com or www.[place].com It should be less than $30.     TENS unit instructions: Do not shower or bathe with the unit on Turn the unit off before removing electrodes or batteries If the electrodes lose stickiness add a drop of water to the electrodes after they are disconnected from the unit and place on plastic sheet. If you continued to have difficulty, call the TENS unit company to purchase more electrodes. Do not apply lotion on the skin area prior to use. Make sure the skin is clean and dry as this will help prolong the life of the electrodes. After use, always check skin for unusual red areas, rash or other skin difficulties. If there are any skin problems, does not apply electrodes to the same area. Never remove the electrodes from the unit by pulling the wires. Do not use the TENS unit or electrodes other than as directed. Do not change electrode placement without consultating your therapist or physician. Keep 2 fingers with between each electrode. Wear time ratio is 2:1, on to off times.    For example on for 30 minutes off for 15 minutes and then on for 30 minutes off for 15 minutes

## 2022-06-29 ENCOUNTER — Ambulatory Visit: Payer: Medicaid Other | Attending: Family Medicine | Admitting: Physical Therapy

## 2022-06-29 ENCOUNTER — Encounter: Payer: Self-pay | Admitting: Physical Therapy

## 2022-06-29 VITALS — BP 135/88 | HR 91

## 2022-06-29 DIAGNOSIS — M542 Cervicalgia: Secondary | ICD-10-CM | POA: Insufficient documentation

## 2022-06-29 DIAGNOSIS — R269 Unspecified abnormalities of gait and mobility: Secondary | ICD-10-CM | POA: Insufficient documentation

## 2022-06-29 DIAGNOSIS — R2681 Unsteadiness on feet: Secondary | ICD-10-CM | POA: Insufficient documentation

## 2022-06-29 DIAGNOSIS — M25511 Pain in right shoulder: Secondary | ICD-10-CM | POA: Diagnosis present

## 2022-06-29 DIAGNOSIS — S060X0A Concussion without loss of consciousness, initial encounter: Secondary | ICD-10-CM | POA: Insufficient documentation

## 2022-06-29 DIAGNOSIS — R42 Dizziness and giddiness: Secondary | ICD-10-CM | POA: Insufficient documentation
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
## 2022-06-30 ENCOUNTER — Encounter: Payer: Self-pay | Admitting: Physical Therapy

## 2022-06-30 NOTE — Addendum Note (Signed)
Addended by: Malachi Carl A on: 06/30/2022 05:06 PM   Modules accepted: Orders

## 2022-07-03 ENCOUNTER — Ambulatory Visit: Payer: Medicaid Other | Admitting: Physical Therapy

## 2022-07-03 VITALS — BP 149/94 | HR 73

## 2022-07-03 DIAGNOSIS — R42 Dizziness and giddiness: Secondary | ICD-10-CM

## 2022-07-03 DIAGNOSIS — R2681 Unsteadiness on feet: Secondary | ICD-10-CM

## 2022-07-03 DIAGNOSIS — M25511 Pain in right shoulder: Secondary | ICD-10-CM

## 2022-07-03 DIAGNOSIS — R269 Unspecified abnormalities of gait and mobility: Secondary | ICD-10-CM

## 2022-07-03 DIAGNOSIS — M542 Cervicalgia: Secondary | ICD-10-CM
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
## 2022-07-06 ENCOUNTER — Encounter: Payer: Self-pay | Admitting: Physical Therapy

## 2022-07-06 ENCOUNTER — Ambulatory Visit: Payer: Medicaid Other | Admitting: Physical Therapy

## 2022-07-06 VITALS — BP 151/81 | HR 98

## 2022-07-06 DIAGNOSIS — R2681 Unsteadiness on feet: Secondary | ICD-10-CM | POA: Diagnosis not present

## 2022-07-06 DIAGNOSIS — R269 Unspecified abnormalities of gait and mobility: Secondary | ICD-10-CM

## 2022-07-06 DIAGNOSIS — M25511 Pain in right shoulder: Secondary | ICD-10-CM

## 2022-07-06 DIAGNOSIS — R42 Dizziness and giddiness: Secondary | ICD-10-CM

## 2022-07-06 DIAGNOSIS — M542 Cervicalgia: Secondary | ICD-10-CM
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
## 2022-07-07 ENCOUNTER — Ambulatory Visit: Payer: Medicaid Other | Admitting: Neurology

## 2022-07-10 ENCOUNTER — Ambulatory Visit: Payer: Medicaid Other | Admitting: Obstetrics and Gynecology

## 2022-07-10 ENCOUNTER — Encounter: Payer: Self-pay | Admitting: Family Medicine

## 2022-07-10 ENCOUNTER — Encounter: Payer: Self-pay | Admitting: Obstetrics and Gynecology

## 2022-07-10 ENCOUNTER — Ambulatory Visit: Payer: Self-pay | Admitting: Physical Therapy

## 2022-07-10 ENCOUNTER — Ambulatory Visit: Payer: Medicaid Other | Admitting: Family Medicine

## 2022-07-10 VITALS — BP 130/84 | HR 66 | Ht 65.0 in | Wt 130.6 lb

## 2022-07-10 VITALS — BP 134/89 | HR 73 | Ht 65.0 in | Wt 130.2 lb

## 2022-07-10 DIAGNOSIS — N96 Recurrent pregnancy loss: Secondary | ICD-10-CM

## 2022-07-10 DIAGNOSIS — Z3009 Encounter for other general counseling and advice on contraception: Secondary | ICD-10-CM

## 2022-07-10 DIAGNOSIS — S060X0D Concussion without loss of consciousness, subsequent encounter: Secondary | ICD-10-CM | POA: Diagnosis not present

## 2022-07-10 MED ORDER — SLYND 4 MG PO TABS: 1.0000 | ORAL_TABLET | Freq: Every day | ORAL | 3 refills | Status: DC

## 2022-07-10 MED ORDER — CLONAZEPAM 0.5 MG PO TABS: 0.5000 mg | ORAL_TABLET | Freq: Two times a day (BID) | ORAL | 0 refills | Status: DC

## 2022-07-10 NOTE — Progress Notes (Signed)
Pt presents for f/u for genetic counseling. Pt recently miscarried. She is interested in birth control today. Unsure of what form, considering IUD. Last unprotected sex over 2 weeks ago.

## 2022-07-10 NOTE — Progress Notes (Signed)
   RETURN GYNECOLOGY VISIT  Subjective:  Marcia Hanson is a 42 y.o. G4P0040 with recent SAB in February presenting for follow up of RPL work up and discussion of contraception.   She last saw Dr. Elly Modena on 3/4. She completed APAS testing that was normal. TSH low at 0.371 with normal fT4 1.36. Normal A1c 5.6. She met with genetic counseling on 3/15 and opted for chromosome analysis that is still pending.   She is interested in contraception. She does not want to keep trying for a pregnancy without a reason for her repeated miscarriages. She would like to see her genetic test results before making a final decision.  I personally reviewed her lupus anticoagulant, beta-2-glycoprotein, cardiolipin Ab, Hgb A1c, and TSH from 06/12/22 as well as her fT4 from 06/23/22.   Objective:   Vitals:   07/10/22 0958  BP: 134/89  Pulse: 73  Weight: 130 lb 3.2 oz (59.1 kg)  Height: 5\' 5"  (1.651 m)   General:  Alert, oriented and cooperative. Patient is in no acute distress.  Skin: Skin is warm and dry. No rash noted.   Cardiovascular: Normal heart rate noted  Respiratory: Normal respiratory effort, no problems with respiration noted   Assessment and Plan:  Marcia Hanson is a 42 y.o. with recurrent pregnancy loss and desire for contraception  1. Recurrent pregnancy loss Reviewed normal TSH, A1c and APAS testing Awaiting chromosome analysis Support provided  2. Encounter for general counseling and advice on contraceptive management Given pt's recent blood pressures, would avoid estrogen containing methods at this time. Discussed options for POPs, Depo Provera, Nexplanon, IUDs, condoms, emergency contraception prn. She is leaning most towards IUD, but will start POPs until her genetic test results are back so she can stop them easily if she wants to try to conceive again. -     Drospirenone (SLYND) 4 MG TABS; Take 1 tablet (4 mg total) by mouth daily.  Follow up pending genetic test  results  Future Appointments  Date Time Provider [place]  07/12/2022  2:45 PM Arliss Journey, PT [facility]  07/14/2022  8:00 AM Risner, Leary Roca, PT [facility]  07/17/2022  2:00 PM Risner, Leary Roca, PT [facility]  07/19/2022  2:45 PM Arliss Journey, PT [facility]  07/24/2022  2:45 PM Risner, Leary Roca, PT [facility]  07/26/2022  1:15 PM Arliss Journey, PT [facility]  07/31/2022 10:15 AM Gregor Hams, MD [facility]  07/31/2022  2:00 PM Risner, Leary Roca, PT [facility]  08/02/2022  2:00 PM Arliss Journey, PT [facility]  08/07/2022  2:45 PM Risner, Leary Roca, PT [facility]  08/09/2022  2:45 PM Arliss Journey, PT [facility]   Inez Catalina, MD

## 2022-07-10 NOTE — Patient Instructions (Signed)
Thank you for coming in today.   STOP xanax.   START Clonazepam twice daily.   If you still have trouble sleeping with the evening dose of Clonazepam and Nortriptyline let me know and I will add another medicine.   We can do this fast.   I agree with what your PCP is doing. If you cannot tolerate oral iron let her know ASAP and get to iron infusion.   Recheck in 3 weeks.

## 2022-07-10 NOTE — Patient Instructions (Signed)
We will make a final plan after your genetic tests come back. Please let us know if you need anything in the meantime.

## 2022-07-12 ENCOUNTER — Encounter: Payer: Self-pay | Admitting: Physical Therapy

## 2022-07-12 ENCOUNTER — Ambulatory Visit: Payer: Self-pay | Attending: Family Medicine | Admitting: Physical Therapy

## 2022-07-12 VITALS — BP 139/91 | HR 71

## 2022-07-12 DIAGNOSIS — R269 Unspecified abnormalities of gait and mobility: Secondary | ICD-10-CM | POA: Diagnosis present

## 2022-07-12 DIAGNOSIS — R42 Dizziness and giddiness: Secondary | ICD-10-CM | POA: Diagnosis present

## 2022-07-12 DIAGNOSIS — M25511 Pain in right shoulder: Secondary | ICD-10-CM | POA: Diagnosis present

## 2022-07-12 DIAGNOSIS — R2681 Unsteadiness on feet: Secondary | ICD-10-CM | POA: Insufficient documentation

## 2022-07-12 DIAGNOSIS — M542 Cervicalgia: Secondary | ICD-10-CM | POA: Insufficient documentation

## 2022-07-12 LAB — CHROMOSOME, BLOOD, ROUTINE
Cells Analyzed: 20
Cells Counted: 20
Cells Karyotyped: 2
GTG Band Resolution Achieved: 500
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
## 2022-07-14 ENCOUNTER — Telehealth: Payer: Self-pay | Admitting: Physical Therapy

## 2022-07-14 ENCOUNTER — Ambulatory Visit: Payer: Medicaid Other | Admitting: Physical Therapy

## 2022-07-14 ENCOUNTER — Telehealth: Payer: Self-pay | Admitting: Genetics

## 2022-07-14 NOTE — Telephone Encounter (Signed)
Called [facility] to return normal blood chromosome results. Left voicemail with [facility] call back number.

## 2022-07-14 NOTE — Telephone Encounter (Signed)
Called patient and LVM regarding missed appointment this morning, reminded of next appointment and 3-cancellation/3-no show policy.   Maryruth Eve, PT, DPT

## 2022-07-17 ENCOUNTER — Ambulatory Visit: Payer: Self-pay | Admitting: Physical Therapy

## 2022-07-17 DIAGNOSIS — M542 Cervicalgia: Secondary | ICD-10-CM

## 2022-07-17 DIAGNOSIS — R2681 Unsteadiness on feet: Secondary | ICD-10-CM

## 2022-07-17 DIAGNOSIS — M25511 Pain in right shoulder: Secondary | ICD-10-CM

## 2022-07-17 DIAGNOSIS — R42 Dizziness and giddiness: Secondary | ICD-10-CM | POA: Diagnosis not present

## 2022-07-17 DIAGNOSIS — R269 Unspecified abnormalities of gait and mobility: Secondary | ICD-10-CM
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
## 2022-07-19 ENCOUNTER — Ambulatory Visit: Payer: Medicaid Other

## 2022-07-19 ENCOUNTER — Ambulatory Visit: Payer: Self-pay | Admitting: Physical Therapy

## 2022-07-19 ENCOUNTER — Encounter: Payer: Self-pay | Admitting: Physical Therapy

## 2022-07-19 ENCOUNTER — Other Ambulatory Visit: Payer: Medicaid Other

## 2022-07-19 ENCOUNTER — Encounter: Payer: Self-pay | Admitting: Family Medicine

## 2022-07-19 VITALS — BP 128/92 | HR 68

## 2022-07-19 DIAGNOSIS — R42 Dizziness and giddiness: Secondary | ICD-10-CM

## 2022-07-19 DIAGNOSIS — R2681 Unsteadiness on feet: Secondary | ICD-10-CM

## 2022-07-19 DIAGNOSIS — M542 Cervicalgia: Secondary | ICD-10-CM
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
## 2022-07-21 MED ORDER — QUETIAPINE FUMARATE 25 MG PO TABS: 25.0000 mg | ORAL_TABLET | Freq: Every day | ORAL | 1 refills | Status: DC

## 2022-07-24 ENCOUNTER — Ambulatory Visit: Payer: Self-pay | Admitting: Physical Therapy

## 2022-07-26 ENCOUNTER — Ambulatory Visit: Payer: Medicaid Other | Admitting: Physical Therapy

## 2022-07-27 ENCOUNTER — Ambulatory Visit: Payer: Self-pay | Admitting: Physical Therapy

## 2022-07-28 ENCOUNTER — Encounter: Payer: Self-pay | Admitting: Physical Therapy

## 2022-07-28 ENCOUNTER — Ambulatory Visit: Payer: Self-pay | Admitting: Physical Therapy

## 2022-07-28 VITALS — BP 123/85 | HR 68

## 2022-07-28 DIAGNOSIS — R2681 Unsteadiness on feet: Secondary | ICD-10-CM

## 2022-07-28 DIAGNOSIS — M25511 Pain in right shoulder: Secondary | ICD-10-CM

## 2022-07-28 DIAGNOSIS — R42 Dizziness and giddiness: Secondary | ICD-10-CM | POA: Diagnosis not present

## 2022-07-28 DIAGNOSIS — R269 Unspecified abnormalities of gait and mobility: Secondary | ICD-10-CM

## 2022-07-28 DIAGNOSIS — M542 Cervicalgia: Secondary | ICD-10-CM
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
## 2022-07-31 ENCOUNTER — Encounter: Payer: Self-pay | Admitting: Physical Therapy

## 2022-07-31 ENCOUNTER — Ambulatory Visit: Payer: Self-pay | Admitting: Physical Therapy

## 2022-07-31 ENCOUNTER — Ambulatory Visit: Payer: Medicaid Other | Admitting: Family Medicine

## 2022-07-31 ENCOUNTER — Encounter: Payer: Self-pay | Admitting: Family Medicine

## 2022-07-31 VITALS — BP 116/80 | HR 94 | Ht 65.0 in | Wt 131.4 lb

## 2022-07-31 VITALS — BP 117/70 | HR 91

## 2022-07-31 DIAGNOSIS — M25511 Pain in right shoulder: Secondary | ICD-10-CM

## 2022-07-31 DIAGNOSIS — S060X0D Concussion without loss of consciousness, subsequent encounter: Secondary | ICD-10-CM

## 2022-07-31 DIAGNOSIS — M542 Cervicalgia: Secondary | ICD-10-CM

## 2022-07-31 DIAGNOSIS — R42 Dizziness and giddiness: Secondary | ICD-10-CM | POA: Diagnosis not present

## 2022-07-31 DIAGNOSIS — R519 Headache, unspecified: Secondary | ICD-10-CM

## 2022-07-31 DIAGNOSIS — R269 Unspecified abnormalities of gait and mobility: Secondary | ICD-10-CM

## 2022-07-31 DIAGNOSIS — R2681 Unsteadiness on feet: Secondary | ICD-10-CM

## 2022-07-31 DIAGNOSIS — G4701 Insomnia due to medical condition: Secondary | ICD-10-CM | POA: Diagnosis not present

## 2022-07-31 DIAGNOSIS — G8929 Other chronic pain: Secondary | ICD-10-CM | POA: Diagnosis not present

## 2022-07-31 MED ORDER — FLUTICASONE PROPIONATE 50 MCG/ACT NA SUSP: NASAL | 3 refills | Status: DC

## 2022-07-31 MED ORDER — TOPIRAMATE 25 MG PO TABS: 25.0000 mg | ORAL_TABLET | Freq: Two times a day (BID) | ORAL | 1 refills | Status: DC

## 2022-07-31 MED ORDER — AZELASTINE HCL 0.1 % NA SOLN: 2.0000 | Freq: Two times a day (BID) | NASAL | 12 refills | Status: DC
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
## 2022-07-31 NOTE — Patient Instructions (Addendum)
Thank you for coming in today.   Increase quetiapine up to  at bedtime (up to 4 pills).  Report back if this is not helpful.   STOP nortriptyline   START Topamax twice daily for headaches.   At work decrease screen brightness and increase font.  Consider blue light blocking lens  Recheck in 3 weeks.   Let me know sooner if this plan if not helpful.   [facility] support group [facility]  Take claritin or zyrtec generic up to 2 x daily   Add nasal spray for allergies as well.

## 2022-08-02 ENCOUNTER — Ambulatory Visit: Payer: Self-pay | Admitting: Physical Therapy

## 2022-08-02 DIAGNOSIS — R42 Dizziness and giddiness: Secondary | ICD-10-CM | POA: Diagnosis not present

## 2022-08-02 DIAGNOSIS — M542 Cervicalgia: Secondary | ICD-10-CM
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
## 2022-08-07 ENCOUNTER — Ambulatory Visit: Payer: Self-pay | Admitting: Physical Therapy

## 2022-08-09 ENCOUNTER — Encounter: Payer: Self-pay | Admitting: Physical Therapy

## 2022-08-09 ENCOUNTER — Ambulatory Visit: Payer: Self-pay | Attending: Family Medicine | Admitting: Physical Therapy

## 2022-08-09 VITALS — BP 130/70

## 2022-08-09 DIAGNOSIS — M542 Cervicalgia: Secondary | ICD-10-CM | POA: Insufficient documentation

## 2022-08-09 DIAGNOSIS — R2681 Unsteadiness on feet: Secondary | ICD-10-CM | POA: Insufficient documentation

## 2022-08-09 DIAGNOSIS — R42 Dizziness and giddiness: Secondary | ICD-10-CM | POA: Diagnosis present
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
## 2022-08-14 ENCOUNTER — Encounter: Payer: Self-pay | Admitting: Physical Therapy

## 2022-08-14 ENCOUNTER — Ambulatory Visit: Payer: Self-pay | Admitting: Physical Therapy

## 2022-08-14 ENCOUNTER — Telehealth: Payer: Self-pay | Admitting: Physical Therapy

## 2022-08-14 DIAGNOSIS — R42 Dizziness and giddiness: Secondary | ICD-10-CM

## 2022-08-14 DIAGNOSIS — H539 Unspecified visual disturbance: Secondary | ICD-10-CM

## 2022-08-14 DIAGNOSIS — R2681 Unsteadiness on feet: Secondary | ICD-10-CM

## 2022-08-14 DIAGNOSIS — M542 Cervicalgia: Secondary | ICD-10-CM

## 2022-08-14 NOTE — Telephone Encounter (Signed)
Referral placed today.  Please contact patient so that she is aware.
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
## 2022-08-14 NOTE — Telephone Encounter (Signed)
Dr. Denyse Amass, [facility] has been seen for physical therapy at [facility]. The patient would benefit from a referral to vision therapy for continued visual deficits s/p concussion.  If you agree, please place an order to [facility] in [place]. Their fax number is [phone]  Thank you so much,  Sherlie Ban, PT, DPT 08/14/22 8:46 AM    [facility] [address] [place], [place]  [address] Phone:  [phone] Fax:  [phone]

## 2022-08-15 NOTE — Telephone Encounter (Signed)
Called pt and advised. She will contact the office if she doesn't hear from anyone within the next week.

## 2022-08-17 ENCOUNTER — Ambulatory Visit: Payer: Medicaid Other | Admitting: Family Medicine

## 2022-08-17 VITALS — BP 122/84 | HR 74 | Ht 65.0 in | Wt 132.0 lb

## 2022-08-17 DIAGNOSIS — G4701 Insomnia due to medical condition: Secondary | ICD-10-CM | POA: Diagnosis not present

## 2022-08-17 DIAGNOSIS — S060X0D Concussion without loss of consciousness, subsequent encounter: Secondary | ICD-10-CM | POA: Diagnosis not present

## 2022-08-17 DIAGNOSIS — H539 Unspecified visual disturbance: Secondary | ICD-10-CM

## 2022-08-17 MED ORDER — QUETIAPINE FUMARATE 50 MG PO TABS: 50.0000 mg | ORAL_TABLET | Freq: Every day | ORAL | 3 refills | Status: DC

## 2022-08-17 MED ORDER — TOPIRAMATE 25 MG PO TABS: 25.0000 mg | ORAL_TABLET | Freq: Two times a day (BID) | ORAL | 3 refills | Status: DC

## 2022-08-17 NOTE — Patient Instructions (Addendum)
Thank you for coming in today.   You should hear from [facility] vision soon  Continue topamax. You will need alternate birth control in the future.   Let me know if you need a refill of anything,   Recheck in 3 months.
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
## 2022-08-18 ENCOUNTER — Ambulatory Visit: Payer: Self-pay | Admitting: Physical Therapy

## 2022-08-21 ENCOUNTER — Encounter: Payer: Medicaid Other | Admitting: Family Medicine

## 2022-08-22 ENCOUNTER — Telehealth: Payer: Self-pay

## 2022-08-22 NOTE — Telephone Encounter (Signed)
Prior Auth required for Quetiapine 50 mg tab  KEY: [id number]

## 2022-08-22 NOTE — Telephone Encounter (Signed)
Prior Authorization initiated for QUETIAPINE via [email or web address] KEY: [id number]

## 2022-08-23 ENCOUNTER — Ambulatory Visit: Payer: Self-pay | Admitting: Physical Therapy

## 2022-08-23 NOTE — Telephone Encounter (Signed)
Prior Auth APPROVED Key: [id number] PA Case ID #: [id number] Rx #: [id number] Valid 08/22/22-08/22/23

## 2022-08-27 ENCOUNTER — Emergency Department (HOSPITAL_COMMUNITY): Payer: Medicaid Other

## 2022-08-27 ENCOUNTER — Observation Stay (HOSPITAL_COMMUNITY)
Admission: EM | Admit: 2022-08-27 | Discharge: 2022-08-28 | Disposition: A | Discharge status: Home / Self Care | Payer: Medicaid Other | Attending: Obstetrics and Gynecology | Admitting: Obstetrics and Gynecology

## 2022-08-27 DIAGNOSIS — G43109 Migraine with aura, not intractable, without status migrainosus: Secondary | ICD-10-CM | POA: Insufficient documentation

## 2022-08-27 DIAGNOSIS — E876 Hypokalemia: Secondary | ICD-10-CM | POA: Diagnosis not present

## 2022-08-27 DIAGNOSIS — R55 Syncope and collapse: Secondary | ICD-10-CM | POA: Diagnosis present

## 2022-08-27 DIAGNOSIS — R531 Weakness: Principal | ICD-10-CM

## 2022-08-27 DIAGNOSIS — Z1152 Encounter for screening for COVID-19: Secondary | ICD-10-CM | POA: Insufficient documentation

## 2022-08-27 DIAGNOSIS — H53461 Homonymous bilateral field defects, right side: Secondary | ICD-10-CM

## 2022-08-27 LAB — COMPREHENSIVE METABOLIC PANEL
ALT: 18 U/L (ref 0–44)
AST: 18 U/L (ref 15–41)
Albumin: 3.7 g/dL (ref 3.5–5.0)
Alkaline Phosphatase: 43 U/L (ref 38–126)
Anion gap: 10 (ref 5–15)
BUN: 6 mg/dL (ref 6–20)
CO2: 23 mmol/L (ref 22–32)
Calcium: 9.1 mg/dL (ref 8.9–10.3)
Chloride: 103 mmol/L (ref 98–111)
Creatinine, Ser: 0.78 mg/dL (ref 0.44–1.00)
GFR, Estimated: >60 mL/min (ref >60)
Glucose, Bld: 104 mg/dL — ABNORMAL HIGH (ref 70–99)
Potassium: 3 mmol/L — ABNORMAL LOW (ref 3.5–5.1)
Sodium: 136 mmol/L (ref 135–145)
Total Bilirubin: 0.6 mg/dL (ref 0.3–1.2)
Total Protein: 7.4 g/dL (ref 6.5–8.1)

## 2022-08-27 LAB — I-STAT CHEM 8, ED
BUN: 6 mg/dL (ref 6–20)
Calcium, Ion: 1.2 mmol/L (ref 1.15–1.40)
Chloride: 103 mmol/L (ref 98–111)
Creatinine, Ser: 0.7 mg/dL (ref 0.44–1.00)
Glucose, Bld: 95 mg/dL (ref 70–99)
HCT: 33 % — ABNORMAL LOW (ref 36.0–46.0)
Hemoglobin: 11.2 g/dL — ABNORMAL LOW (ref 12.0–15.0)
Potassium: 3.1 mmol/L — ABNORMAL LOW (ref 3.5–5.1)
Sodium: 140 mmol/L (ref 135–145)
TCO2: 25 mmol/L (ref 22–32)

## 2022-08-27 LAB — URINALYSIS, ROUTINE W REFLEX MICROSCOPIC
Bilirubin Urine: NEGATIVE
Glucose, UA: NEGATIVE mg/dL
Hgb urine dipstick: NEGATIVE
Ketones, ur: NEGATIVE mg/dL
Leukocytes,Ua: NEGATIVE
Nitrite: NEGATIVE
Protein, ur: NEGATIVE mg/dL
Specific Gravity, Urine: 1.02 (ref 1.005–1.030)
pH: 6 (ref 5.0–8.0)

## 2022-08-27 LAB — SARS CORONAVIRUS 2 BY RT PCR: SARS Coronavirus 2 by RT PCR: NEGATIVE

## 2022-08-27 LAB — I-STAT BETA HCG BLOOD, ED (MC, WL, AP ONLY): I-stat hCG, quantitative: <5 m[IU]/mL (ref <5)

## 2022-08-27 LAB — CBC WITH DIFFERENTIAL/PLATELET
Abs Immature Granulocytes: 0.01 10*3/uL (ref 0.00–0.07)
Basophils Absolute: 0 10*3/uL (ref 0.0–0.1)
Basophils Relative: 1 %
Eosinophils Absolute: 0.1 10*3/uL (ref 0.0–0.5)
Eosinophils Relative: 2 %
HCT: 31.6 % — ABNORMAL LOW (ref 36.0–46.0)
Hemoglobin: 9.4 g/dL — ABNORMAL LOW (ref 12.0–15.0)
Immature Granulocytes: 0 %
Lymphocytes Relative: 26 %
Lymphs Abs: 1.3 10*3/uL (ref 0.7–4.0)
MCH: 22.8 pg — ABNORMAL LOW (ref 26.0–34.0)
MCHC: 29.7 g/dL — ABNORMAL LOW (ref 30.0–36.0)
MCV: 76.5 fL — ABNORMAL LOW (ref 80.0–100.0)
Monocytes Absolute: 0.4 10*3/uL (ref 0.1–1.0)
Monocytes Relative: 8 %
Neutro Abs: 3.1 10*3/uL (ref 1.7–7.7)
Neutrophils Relative %: 63 %
Platelets: 369 10*3/uL (ref 150–400)
RBC: 4.13 MIL/uL (ref 3.87–5.11)
RDW: 16.7 % — ABNORMAL HIGH (ref 11.5–15.5)
WBC: 5 10*3/uL (ref 4.0–10.5)
nRBC: 0 % (ref 0.0–0.2)

## 2022-08-27 LAB — APTT: aPTT: 28 seconds (ref 24–36)

## 2022-08-27 LAB — PROTIME-INR
INR: 1.1 (ref 0.8–1.2)
Prothrombin Time: 14.5 seconds (ref 11.4–15.2)

## 2022-08-27 LAB — ETHANOL: Alcohol, Ethyl (B): <10 mg/dL (ref <10)

## 2022-08-27 LAB — PREGNANCY, URINE: Preg Test, Ur: NEGATIVE

## 2022-08-27 LAB — D-DIMER, QUANTITATIVE: D-Dimer, Quant: 0.29 ug/mL-FEU (ref 0.00–0.50)

## 2022-08-27 MED ORDER — KETOROLAC TROMETHAMINE 15 MG/ML IJ SOLN
15.0000 mg | Freq: Once | INTRAMUSCULAR | Status: AC
  Administered 2022-08-27: 15 mg via INTRAVENOUS
  Filled 2022-08-27: qty 1

## 2022-08-27 MED ORDER — PROCHLORPERAZINE EDISYLATE 10 MG/2ML IJ SOLN
10.0000 mg | Freq: Once | INTRAMUSCULAR | Status: AC
  Administered 2022-08-27: 10 mg via INTRAVENOUS
  Filled 2022-08-27: qty 2

## 2022-08-27 MED ORDER — ENOXAPARIN SODIUM 40 MG/0.4ML IJ SOSY
40.0000 mg | PREFILLED_SYRINGE | INTRAMUSCULAR | Status: DC
  Administered 2022-08-27: 40 mg via SUBCUTANEOUS
  Filled 2022-08-27: qty 0.4

## 2022-08-27 MED ORDER — HYDROMORPHONE HCL 1 MG/ML IJ SOLN: 0.5000 mg | INTRAMUSCULAR | Status: DC | PRN

## 2022-08-27 MED ORDER — DIPHENHYDRAMINE HCL 50 MG/ML IJ SOLN
12.5000 mg | Freq: Once | INTRAMUSCULAR | Status: AC
  Administered 2022-08-27: 12.5 mg via INTRAVENOUS
  Filled 2022-08-27: qty 1

## 2022-08-27 MED ORDER — HYDRALAZINE HCL 20 MG/ML IJ SOLN: 5.0000 mg | Freq: Four times a day (QID) | INTRAMUSCULAR | Status: DC | PRN

## 2022-08-27 MED ORDER — PROCHLORPERAZINE EDISYLATE 10 MG/2ML IJ SOLN: 5.0000 mg | Freq: Four times a day (QID) | INTRAMUSCULAR | Status: DC | PRN

## 2022-08-27 MED ORDER — OXYCODONE HCL 5 MG PO TABS: 5.0000 mg | ORAL_TABLET | Freq: Four times a day (QID) | ORAL | Status: DC | PRN

## 2022-08-27 MED ORDER — POTASSIUM CHLORIDE 2 MEQ/ML IV SOLN
INTRAVENOUS | Status: DC
  Filled 2022-08-27: qty 1000

## 2022-08-27 MED ORDER — POTASSIUM CHLORIDE CRYS ER 20 MEQ PO TBCR
40.0000 meq | EXTENDED_RELEASE_TABLET | Freq: Once | ORAL | Status: AC
  Administered 2022-08-27: 40 meq via ORAL
  Filled 2022-08-27: qty 2

## 2022-08-27 MED ORDER — ONDANSETRON HCL 4 MG/2ML IJ SOLN
4.0000 mg | Freq: Once | INTRAMUSCULAR | Status: DC
  Filled 2022-08-27: qty 2

## 2022-08-27 MED ORDER — IOHEXOL 350 MG/ML SOLN
75.0000 mL | Freq: Once | INTRAVENOUS | Status: AC | PRN
  Administered 2022-08-27: 75 mL via INTRAVENOUS

## 2022-08-27 MED ORDER — POLYETHYLENE GLYCOL 3350 17 G PO PACK: 17.0000 g | PACK | Freq: Every day | ORAL | Status: DC | PRN

## 2022-08-27 MED ORDER — ACETAMINOPHEN 500 MG PO TABS: 500.0000 mg | ORAL_TABLET | Freq: Four times a day (QID) | ORAL | Status: DC | PRN

## 2022-08-27 NOTE — ED Notes (Signed)
Pt to MRI with stroke Rn and this RN.

## 2022-08-27 NOTE — ED Notes (Signed)
Called to cancel code stroke per branham
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
## 2022-08-27 NOTE — ED Notes (Signed)
Pt to ct on monitor with RN
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
## 2022-08-27 NOTE — ED Notes (Signed)
Other Rn states that she was called to the room because pt had passed out, went to room, pt is awake, sig other at bedside, pt oriented times three, pt has weakness in R arm and R leg.  PA notified.

## 2022-08-27 NOTE — ED Notes (Signed)
PA at bedside code stroke called, pt has weakness in grips to R hand, pt has weakness in R arm and sig weakness in R leg.
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
## 2022-08-28 ENCOUNTER — Other Ambulatory Visit: Payer: Self-pay

## 2022-08-28 ENCOUNTER — Encounter (HOSPITAL_COMMUNITY): Payer: Self-pay | Admitting: Internal Medicine

## 2022-08-28 ENCOUNTER — Observation Stay (HOSPITAL_COMMUNITY): Payer: Medicaid Other

## 2022-08-28 ENCOUNTER — Ambulatory Visit (HOSPITAL_BASED_OUTPATIENT_CLINIC_OR_DEPARTMENT_OTHER): Payer: Medicaid Other

## 2022-08-28 ENCOUNTER — Ambulatory Visit: Payer: Self-pay | Admitting: Physical Therapy

## 2022-08-28 DIAGNOSIS — G43109 Migraine with aura, not intractable, without status migrainosus: Secondary | ICD-10-CM | POA: Insufficient documentation

## 2022-08-28 DIAGNOSIS — R569 Unspecified convulsions: Secondary | ICD-10-CM | POA: Diagnosis not present

## 2022-08-28 DIAGNOSIS — R55 Syncope and collapse: Secondary | ICD-10-CM

## 2022-08-28 DIAGNOSIS — R531 Weakness: Secondary | ICD-10-CM | POA: Diagnosis not present

## 2022-08-28 LAB — BASIC METABOLIC PANEL
Anion gap: 7 (ref 5–15)
BUN: 6 mg/dL (ref 6–20)
CO2: 21 mmol/L — ABNORMAL LOW (ref 22–32)
Calcium: 8.4 mg/dL — ABNORMAL LOW (ref 8.9–10.3)
Chloride: 106 mmol/L (ref 98–111)
Creatinine, Ser: 0.73 mg/dL (ref 0.44–1.00)
GFR, Estimated: >60 mL/min (ref >60)
Glucose, Bld: 81 mg/dL (ref 70–99)
Potassium: 3.5 mmol/L (ref 3.5–5.1)
Sodium: 134 mmol/L — ABNORMAL LOW (ref 135–145)

## 2022-08-28 LAB — ECHOCARDIOGRAM COMPLETE
Area-P 1/2: 3.77 cm2
Calc EF: 61.1 %
S' Lateral: 2.8 cm
Single Plane A2C EF: 61.8 %
Single Plane A4C EF: 62.9 %
Weight: 2190.49 oz

## 2022-08-28 LAB — PHOSPHORUS: Phosphorus: 4.3 mg/dL (ref 2.5–4.6)

## 2022-08-28 LAB — CBC
HCT: 29.8 % — ABNORMAL LOW (ref 36.0–46.0)
Hemoglobin: 8.8 g/dL — ABNORMAL LOW (ref 12.0–15.0)
MCH: 22.7 pg — ABNORMAL LOW (ref 26.0–34.0)
MCHC: 29.5 g/dL — ABNORMAL LOW (ref 30.0–36.0)
MCV: 76.8 fL — ABNORMAL LOW (ref 80.0–100.0)
Platelets: 349 10*3/uL (ref 150–400)
RBC: 3.88 MIL/uL (ref 3.87–5.11)
RDW: 17 % — ABNORMAL HIGH (ref 11.5–15.5)
WBC: 3.6 10*3/uL — ABNORMAL LOW (ref 4.0–10.5)
nRBC: 0 % (ref 0.0–0.2)

## 2022-08-28 LAB — MAGNESIUM: Magnesium: 2.1 mg/dL (ref 1.7–2.4)

## 2022-08-28 LAB — HIV ANTIBODY (ROUTINE TESTING W REFLEX): HIV Screen 4th Generation wRfx: NONREACTIVE

## 2022-08-28 NOTE — Progress Notes (Signed)
Echocardiogram 2D Echocardiogram has been performed.  Marcia Hanson 08/28/2022, 10:54 AM

## 2022-08-28 NOTE — Progress Notes (Signed)
EEG complete - results pending 

## 2022-08-28 NOTE — Discharge Instructions (Signed)
No driving for 6 months. 

## 2022-08-28 NOTE — Discharge Summary (Signed)
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 1704  AST 18  ALT 18  ALKPHOS 43  BILITOT 0.6  PROT 7.4  ALBUMIN 3.7   No results for input(s): "LIPASE", "AMYLASE" in the last 168 hours. No results for input(s): "AMMONIA" in the last 168 hours. CBC: Recent Labs  Lab 08/27/22 1704 08/27/22 1851 08/28/22 0410  WBC 5.0  --  3.6*  NEUTROABS 3.1  --   --   HGB 9.4* 11.2* 8.8*  HCT 31.6* 33.0* 29.8*  MCV 76.5*  --  76.8*  PLT 369  --  349   Cardiac Enzymes: No results for input(s): "CKTOTAL", "CKMB", "CKMBINDEX", "TROPONINI" in the last 168 hours. BNP: BNP (last 3 results) No results for input(s): "BNP" in the last 8760 hours.  ProBNP (last 3 results) No results for input(s): "PROBNP" in the last 8760 hours.  CBG: No results for input(s): "GLUCAP" in the last 168 hours.     Signed:  Silvano Bilis MD.  [facility] 08/28/2022, 4:17 PM

## 2022-08-28 NOTE — TOC Progression Note (Signed)
Transition of Care [facility]) - Progression Note    Patient Details  Name: Marcia Hanson MRN: [id number] Date of Birth: 23-Apr-1980  Transition of Care [facility]) CM/SW Contact  Janae Bridgeman, RN Phone Number: 08/28/2022, 4:32 PM  Clinical Narrative:    Transportation resources provided in the AVS to assist with resources since patient's recommendation in the discharge summary included no driving for the next 6 months.  Transition of Care [facility]) Screening Note   Patient Details  Name: Marcia Hanson Date of Birth: 12-21-1980   Transition of Care [facility]) CM/SW Contact:    Janae Bridgeman, RN Phone Number: 08/28/2022, 4:32 PM    Transition of Care Department [facility]) has reviewed patient and no TOC needs have been identified at this time. We will continue to monitor patient advancement through interdisciplinary progression rounds. If new patient transition needs arise, please place a TOC consult.         Expected Discharge Plan and Services         Expected Discharge Date: 08/28/22                                     Social Determinants of Health (SDOH) Interventions SDOH Screenings   Food Insecurity: No Food Insecurity (08/28/2022)  Housing: Low Risk  (08/28/2022)  Transportation Needs: No Transportation Needs (08/28/2022)  Utilities: Not At Risk (08/28/2022)  Depression (PHQ2-9): Low Risk  (05/15/2022)  Tobacco Use: Low Risk  (08/28/2022)    Readmission Risk Interventions     No data to display

## 2022-08-28 NOTE — Procedures (Signed)
Patient Name: Marcia Hanson  MRN: [id number]  Epilepsy Attending: Charlsie Quest  Referring Physician/Provider: Kathrynn Running, MD  Date: 08/28/2022 Duration: 22.48 mins  Patient history: 42 year old female with recurrent syncope with right-sided weakness and visual change in the setting of right retro-orbital headache. EEG to evaluate for seizure  Level of alertness: Awake, drowsy  AEDs during EEG study: None  Technical aspects: This EEG study was done with scalp electrodes positioned according to the 10-20 International system of electrode placement. Electrical activity was reviewed with band pass filter of 1-70Hz , sensitivity of 7 uV/mm, display speed of 29mm/sec with a 60Hz  notched filter applied as appropriate. EEG data were recorded continuously and digitally stored.  Video monitoring was available and reviewed as appropriate.  Description: The posterior dominant rhythm consists of 10 Hz activity of moderate voltage (25-35 uV) seen predominantly in posterior head regions, symmetric and reactive to eye opening and eye closing. Drowsiness was characterized by attenuation of the posterior background rhythm. Hyperventilation and photic stimulation were not performed.     IMPRESSION: This study is within normal limits. No seizures or epileptiform discharges were seen throughout the recording.  A normal interictal EEG does not exclude the diagnosis of epilepsy.  Desi Carby Annabelle Harman

## 2022-08-28 NOTE — ED Notes (Signed)
Lab at BS 
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
## 2022-08-28 NOTE — ED Notes (Signed)
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 C)   TempSrc:   Oral   SpO2: 98% 100% 100% 100%  Weight:      PainSc:   0-No pain     Isolation Precautions No active isolations  Medications Medications  ondansetron (ZOFRAN) injection 4 mg (0 mg Intravenous Hold 08/27/22 2047)  enoxaparin (LOVENOX) injection 40 mg (40 mg Subcutaneous Given 08/27/22 2354)  hydrALAZINE (APRESOLINE) injection 5 mg (has no administration in time range)  acetaminophen (TYLENOL) tablet 500 mg (has no administration in time range)  oxyCODONE (Oxy IR/ROXICODONE) immediate release tablet 5 mg (has no administration in time range)  HYDROmorphone (DILAUDID) injection 0.5 mg (has no administration in time range)  polyethylene glycol (MIRALAX / GLYCOLAX) packet 17 g (has no administration in time range)   prochlorperazine (COMPAZINE) injection 5 mg (has no administration in time range)  iohexol (OMNIPAQUE) 350 MG/ML injection 75 mL (75 mLs Intravenous Contrast Given 08/27/22 1837)  iohexol (OMNIPAQUE) 350 MG/ML injection 75 mL (75 mLs Intravenous Contrast Given 08/27/22 1908)  potassium chloride SA (KLOR-CON M) CR tablet 40 mEq (40 mEq Oral Given 08/27/22 2038)  prochlorperazine (COMPAZINE) injection 10 mg (10 mg Intravenous Given 08/27/22 2043)  diphenhydrAMINE (BENADRYL) injection 12.5 mg (12.5 mg Intravenous Given 08/27/22 2042)  ketorolac (TORADOL) 15 MG/ML injection 15 mg (15 mg Intravenous Given 08/27/22 2047)    Mobility walks     Focused Assessments    R Recommendations: See Admitting Provider Note  Report given to:   Additional Notes:
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
## 2022-08-28 NOTE — ED Notes (Signed)
Heart rate reading at 50 and below nurse is aware.

## 2022-09-06 ENCOUNTER — Ambulatory Visit: Payer: Self-pay | Admitting: Physical Therapy

## 2022-09-20 ENCOUNTER — Ambulatory Visit: Payer: Medicaid Other | Attending: Family Medicine

## 2022-09-20 ENCOUNTER — Ambulatory Visit: Payer: Medicaid Other | Admitting: Family Medicine

## 2022-09-20 VITALS — BP 142/88 | HR 82 | Ht 65.0 in | Wt 130.0 lb

## 2022-09-20 DIAGNOSIS — S060X0D Concussion without loss of consciousness, subsequent encounter: Secondary | ICD-10-CM | POA: Diagnosis not present

## 2022-09-20 DIAGNOSIS — R55 Syncope and collapse: Secondary | ICD-10-CM

## 2022-09-20 DIAGNOSIS — G43109 Migraine with aura, not intractable, without status migrainosus: Secondary | ICD-10-CM | POA: Diagnosis not present

## 2022-09-20 MED ORDER — TOPIRAMATE ER 100 MG PO CAP24: 100.0000 mg | ORAL_CAPSULE | Freq: Every day | ORAL | 1 refills | Status: DC

## 2022-09-20 NOTE — Progress Notes (Unsigned)
Enrolled patient for a 7 day Zio XT monitor to be mailed to patients home   DOD to read 
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
## 2022-09-20 NOTE — Patient Instructions (Addendum)
Thank you for coming in today.   Increase Topamax to extended release 100mg  daily  You should hear about long term monitor.   You should hear from Neruology.   Follow up with me after your cardiology visit at the end of July or sooner if needed.

## 2022-09-22 ENCOUNTER — Telehealth: Payer: Self-pay | Admitting: Family Medicine

## 2022-09-22 NOTE — Telephone Encounter (Signed)
Mailed completed [facility] Medical Review form, as per instructions on form. Tried to fax several times, not successful.

## 2022-09-28 ENCOUNTER — Encounter: Payer: Self-pay | Admitting: Neurology

## 2022-10-03 ENCOUNTER — Encounter: Payer: Self-pay | Admitting: Family Medicine

## 2022-10-03 NOTE — Telephone Encounter (Signed)
Called and spoke with patient.   I explained that it appears that there were several attempts to fax the form to the [facility] but all attempts were unsuccessful. The form was placed in outbox for mailing on 09/22/22, which means that it probably went out on 09/25/22. Letter for termination of driver's license was drafted from [facility] on 09/29/22. I recommended that pt contact the DMV to see if they have received the form since it has been over a week now. She will contact the office if the [facility] still has not received the form. I will attempt to make document visible to pt via MyChart and will let her know if I am unable to attach document.

## 2022-11-07 ENCOUNTER — Ambulatory Visit: Payer: Medicaid Other | Admitting: Cardiology

## 2022-11-13 NOTE — Progress Notes (Signed)
Long-term monitor shows episodes of SVT (supraventricular tachycardia which she will feel like heart racing) and may have caused your passing out.  See if you get an appointment sooner than you have scheduled in September with cardiology.

## 2022-11-15 ENCOUNTER — Ambulatory Visit: Payer: Medicaid Other | Admitting: Family Medicine

## 2022-11-15 VITALS — BP 122/84 | HR 83 | Ht 65.0 in | Wt 130.0 lb

## 2022-11-15 DIAGNOSIS — G8929 Other chronic pain: Secondary | ICD-10-CM

## 2022-11-15 DIAGNOSIS — M5412 Radiculopathy, cervical region: Secondary | ICD-10-CM

## 2022-11-15 DIAGNOSIS — M542 Cervicalgia: Secondary | ICD-10-CM

## 2022-11-15 DIAGNOSIS — R55 Syncope and collapse: Secondary | ICD-10-CM | POA: Diagnosis not present

## 2022-11-15 DIAGNOSIS — R519 Headache, unspecified: Secondary | ICD-10-CM

## 2022-11-15 MED ORDER — AIMOVIG 70 MG/ML ~~LOC~~ SOAJ: 70.0000 mg | SUBCUTANEOUS | 12 refills | Status: DC

## 2022-11-15 MED ORDER — SUMATRIPTAN SUCCINATE 50 MG PO TABS: 50.0000 mg | ORAL_TABLET | ORAL | 12 refills | Status: DC | PRN
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
## 2022-11-15 NOTE — Patient Instructions (Addendum)
Thank you for coming in today.   Please call [facility] ([facility]) at [phone] to schedule your spine injection.    Keep you appointment with Dr Adriana Mccallum and Cardiology.   START injection Aimovig.   Start imitrex for headaches when you have them.   Recheck in about 1 month.

## 2022-11-16 ENCOUNTER — Telehealth: Payer: Self-pay

## 2022-11-16 ENCOUNTER — Encounter: Payer: Medicaid Other | Admitting: Family Medicine

## 2022-11-16 NOTE — Telephone Encounter (Signed)
Prior Marcia Hanson questions is pt has had base line pregnancy test.   Last Urine HCG 08/2022.  Called pt and left VM to call the office.

## 2022-11-16 NOTE — Telephone Encounter (Signed)
Per visit note 11/15/22:  Assessment and Plan: 42 y.o. female with complicated migraine headache.  Not well-controlled with extended release Topamax.  She is already had trials of immediately's Topamax and nortriptyline which did not help.  She still having headaches 15 days/month. Plan to add Aimovig.  She is scheduled to see neurology on August 14 which I think will be helpful.  Additionally will add sumatriptan hand for immediate control of bad headaches.   Additionally she had a event where she lost consciousness thought to be a complicated migraine.  This could have been a syncopal event.  She on a long-term monitor did have an episode of SVT that lasted 2 minutes.  She has a follow-up appointment scheduled with cardiology in the end of September.   Lastly she has what seems to be cervical radiculopathy.  She does have some potential for that based on her cervical spine MRI.  Plan for epidural steroid injection as well.   Recheck with me in 1 month.

## 2022-11-16 NOTE — Telephone Encounter (Signed)
Prior auth required for [email or web address] KEY: [id number]

## 2022-11-21 ENCOUNTER — Encounter: Payer: Self-pay | Admitting: Family Medicine

## 2022-11-21 NOTE — Telephone Encounter (Signed)
Called and spoke with patient, she advised that there is NO chance that she could be pregnant. She had a miscarriage earlier in the year and was told that her levels may remain elevated for a little while. Confirmed with pt that there is NO chance that she may be pregnant at this time. Pt confirms that she is NOT currently pregnant.
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
## 2022-11-21 NOTE — Telephone Encounter (Signed)
APPROVED  Prior Authorization # [id number] Valid 11/21/22-02/19/23

## 2022-11-22 ENCOUNTER — Ambulatory Visit: Payer: Medicaid Other | Admitting: Neurology

## 2022-11-22 ENCOUNTER — Encounter: Payer: Self-pay | Admitting: Neurology

## 2022-11-22 VITALS — BP 158/90 | HR 67 | Ht 65.0 in | Wt 134.0 lb

## 2022-11-22 DIAGNOSIS — G43009 Migraine without aura, not intractable, without status migrainosus: Secondary | ICD-10-CM | POA: Diagnosis not present

## 2022-11-22 DIAGNOSIS — G43409 Hemiplegic migraine, not intractable, without status migrainosus: Secondary | ICD-10-CM

## 2022-11-22 DIAGNOSIS — F0781 Postconcussional syndrome: Secondary | ICD-10-CM

## 2022-11-22 MED ORDER — TOPIRAMATE ER 50 MG PO CAP24: 50.0000 mg | ORAL_CAPSULE | Freq: Every day | ORAL | 0 refills | Status: DC

## 2022-11-22 NOTE — Patient Instructions (Addendum)
  Start Aimovig 70mg  every 4 weeks.  Stop Trokendi ER 100mg .  Take 50mg  daily for one week, then STOP Do not take sumatriptan.  Instead, take Nurtec at earliest onset of headache.  Maximum 1 tablet in 24 hours.  Take ondansetron at earliest onset for nausea. Limit use of pain relievers to no more than 2 days out of the week.  These medications include acetaminophen, NSAIDs (ibuprofen/Advil/Motrin, naproxen/Aleve, triptans (Imitrex/sumatriptan), Excedrin, and narcotics.  This will help reduce risk of rebound headaches. Be aware of common food triggers Routine exercise Stay adequately hydrated (aim for 64 oz water daily) Keep headache diary Maintain proper stress management Maintain proper sleep hygiene Do not skip meals Consider supplements:  magnesium citrate 400mg  daily, riboflavin 400mg  daily, coenzyme Q10 300mg  daily.

## 2022-12-06 NOTE — Discharge Instructions (Signed)

## 2022-12-07 ENCOUNTER — Ambulatory Visit: Payer: Medicaid Other | Attending: Cardiology | Admitting: Cardiology

## 2022-12-07 ENCOUNTER — Ambulatory Visit
Admission: RE | Admit: 2022-12-07 | Discharge: 2022-12-07 | Disposition: A | Discharge status: Home / Self Care | Payer: Medicaid Other | Source: Ambulatory Visit | Attending: Family Medicine | Admitting: Family Medicine

## 2022-12-07 ENCOUNTER — Encounter: Payer: Self-pay | Admitting: Cardiology

## 2022-12-07 VITALS — HR 88 | Ht 65.0 in | Wt 133.2 lb

## 2022-12-07 DIAGNOSIS — G43109 Migraine with aura, not intractable, without status migrainosus: Secondary | ICD-10-CM | POA: Diagnosis not present

## 2022-12-07 DIAGNOSIS — M5412 Radiculopathy, cervical region: Secondary | ICD-10-CM

## 2022-12-07 DIAGNOSIS — R55 Syncope and collapse: Secondary | ICD-10-CM | POA: Diagnosis not present

## 2022-12-07 DIAGNOSIS — G8929 Other chronic pain: Secondary | ICD-10-CM

## 2022-12-07 DIAGNOSIS — M542 Cervicalgia: Secondary | ICD-10-CM

## 2022-12-07 MED ORDER — TRIAMCINOLONE ACETONIDE 40 MG/ML IJ SUSP (RADIOLOGY)
60.0000 mg | Freq: Once | INTRAMUSCULAR | Status: AC
  Administered 2022-12-07: 60 mg via EPIDURAL

## 2022-12-07 MED ORDER — PROPRANOLOL HCL 10 MG PO TABS: 10.0000 mg | ORAL_TABLET | Freq: Every day | ORAL | 3 refills | Status: DC

## 2022-12-07 MED ORDER — IOPAMIDOL (ISOVUE-M 300) INJECTION 61%
1.0000 mL | Freq: Once | INTRAMUSCULAR | Status: AC | PRN
  Administered 2022-12-07: 1 mL via EPIDURAL

## 2022-12-07 NOTE — Patient Instructions (Addendum)
Medication Instructions:  Your physician recommends that you continue on your current medications as directed. Please refer to the Current Medication list given to you today.    *If you need a refill on your cardiac medications before your next appointment, please call your pharmacy*  Testing/Procedures: Echo will be scheduled at [address].  Your physician has requested that you have an echocardiogram. Echocardiography is a painless test that uses sound waves to create images of your heart. It provides your doctor with information about the size and shape of your heart and how well your heart's chambers and valves are working. This procedure takes approximately one hour. There are no restrictions for this procedure. Please do NOT wear cologne, perfume, aftershave, or lotions (deodorant is allowed). Please arrive 15 minutes prior to your appointment time.    Follow-Up: At [facility], you and your health needs are our priority.  As part of our continuing mission to provide you with exceptional heart care, we have created designated Provider Care Teams.  These Care Teams include your primary Cardiologist (physician) and Advanced Practice Providers (APPs -  Physician Assistants and Nurse Practitioners) who all work together to provide you with the care you need, when you need it.  We recommend signing up for the patient portal called "MyChart".  Sign up information is provided on this After Visit Summary.  MyChart is used to connect with patients for Virtual Visits (Telemedicine).  Patients are able to view lab/test results, encounter notes, upcoming appointments, etc.  Non-urgent messages can be sent to your provider as well.   To learn more about what you can do with MyChart, go to [email or web address].    Your next appointment:   4 month(s)  The format for your next appointment:   In Person  Provider:   Thomasene Ripple, DO
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
## 2022-12-13 ENCOUNTER — Ambulatory Visit: Payer: Medicaid Other | Admitting: Family Medicine
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
## 2022-12-18 ENCOUNTER — Ambulatory Visit: Payer: Medicaid Other | Admitting: Family Medicine
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
## 2022-12-25 ENCOUNTER — Ambulatory Visit (HOSPITAL_COMMUNITY): Payer: Medicaid Other
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
## 2022-12-29 ENCOUNTER — Ambulatory Visit: Payer: Medicaid Other | Admitting: Family Medicine

## 2023-01-08 ENCOUNTER — Ambulatory Visit: Payer: Medicaid Other | Admitting: Cardiology

## 2023-01-08 ENCOUNTER — Ambulatory Visit (HOSPITAL_COMMUNITY): Payer: Medicaid Other

## 2023-01-11 ENCOUNTER — Ambulatory Visit: Payer: Medicaid Other | Admitting: Neurology

## 2023-01-22 ENCOUNTER — Telehealth: Payer: Self-pay

## 2023-01-22 NOTE — Telephone Encounter (Signed)
Prior auth renewal  [email or web address] KEY: [id number]

## 2023-01-23 NOTE — Telephone Encounter (Signed)
AIMOVIG RX APPROVED PA# [id number] Valid: 01/23/23-01/23/24

## 2023-01-23 NOTE — Telephone Encounter (Signed)
Prior Authorization initiated for AIMOVIG via [email or web address] KEY: [id number]

## 2023-01-25 ENCOUNTER — Encounter (HOSPITAL_COMMUNITY): Payer: Self-pay | Admitting: Cardiology

## 2023-01-25 ENCOUNTER — Ambulatory Visit (HOSPITAL_COMMUNITY): Payer: Medicaid Other | Attending: Cardiology

## 2023-02-27 ENCOUNTER — Encounter: Payer: Self-pay | Admitting: Family Medicine

## 2023-03-06 ENCOUNTER — Encounter: Payer: Self-pay | Admitting: Family Medicine
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
## 2023-03-26 ENCOUNTER — Encounter: Payer: Self-pay | Admitting: Neurology

## 2023-03-26 ENCOUNTER — Ambulatory Visit: Payer: Medicaid Other | Admitting: Neurology

## 2023-03-26 ENCOUNTER — Telehealth: Payer: Self-pay

## 2023-03-26 VITALS — BP 125/77 | HR 79 | Ht 65.0 in | Wt 136.0 lb

## 2023-03-26 DIAGNOSIS — G43009 Migraine without aura, not intractable, without status migrainosus: Secondary | ICD-10-CM

## 2023-03-26 DIAGNOSIS — M542 Cervicalgia: Secondary | ICD-10-CM

## 2023-03-26 MED ORDER — AIMOVIG 140 MG/ML ~~LOC~~ SOAJ: 140.0000 mg | SUBCUTANEOUS | 11 refills | Status: DC

## 2023-03-26 MED ORDER — TIZANIDINE HCL 2 MG PO TABS: 2.0000 mg | ORAL_TABLET | Freq: Three times a day (TID) | ORAL | 5 refills | Status: DC | PRN

## 2023-03-26 MED ORDER — NURTEC 75 MG PO TBDP: 1.0000 | ORAL_TABLET | Freq: Every day | ORAL | 11 refills | Status: DC | PRN

## 2023-03-26 NOTE — Telephone Encounter (Signed)
Patient seen in office today, PA needed for Aimovig.

## 2023-03-26 NOTE — Patient Instructions (Signed)
Increase Aimovig to 140mg  every 28 days Nurtec once daily as needed Tizanidine as needed for neck pain.  Caution for drowsiness.  Take 1 every night at bedtime and may take up to 2 times daily during day as needed Ondansetron for nausea Follow up 5 months.

## 2023-03-29 ENCOUNTER — Ambulatory Visit: Payer: Medicaid Other | Admitting: Cardiology

## 2023-04-05 ENCOUNTER — Other Ambulatory Visit (HOSPITAL_COMMUNITY): Payer: Self-pay

## 2023-04-05 ENCOUNTER — Telehealth: Payer: Self-pay

## 2023-04-05 NOTE — Telephone Encounter (Signed)
When I try to do Prior Authorization it will not let me do a Renewal Prior Authorization yet since it is still approved through 04/16/2023

## 2023-04-05 NOTE — Telephone Encounter (Signed)
PA needed for Nurtec. 

## 2023-04-06 ENCOUNTER — Telehealth: Payer: Self-pay | Admitting: Pharmacy Technician

## 2023-04-06 NOTE — Telephone Encounter (Signed)
Pharmacy Patient Advocate Encounter   Received notification from Pt Calls Messages that prior authorization for NURTEC 75MG  is required/requested.   Insurance verification completed.   The patient is insured through [facility] .   Per test claim: PA required; PA submitted to above mentioned insurance via CoverMyMeds Key/confirmation #/EOC [id number] Status is pending

## 2023-04-06 NOTE — Telephone Encounter (Signed)
PA has been submitted, and telephone encounter has been created. 

## 2023-04-18 ENCOUNTER — Ambulatory Visit (HOSPITAL_COMMUNITY): Payer: Medicaid Other | Attending: Cardiology

## 2023-04-18 DIAGNOSIS — R55 Syncope and collapse: Secondary | ICD-10-CM | POA: Diagnosis present

## 2023-04-18 DIAGNOSIS — Q211 Atrial septal defect, unspecified: Secondary | ICD-10-CM

## 2023-04-18 LAB — ECHOCARDIOGRAM LIMITED BUBBLE STUDY
Area-P 1/2: 3.65 cm2
S' Lateral: 2.8 cm

## 2023-04-23 ENCOUNTER — Other Ambulatory Visit (HOSPITAL_COMMUNITY): Payer: Self-pay

## 2023-04-23 NOTE — Telephone Encounter (Signed)
 Pharmacy Patient Advocate Encounter  Received notification from [facility] that Prior Authorization for Nurtec ODT has been APPROVED from 04/23/23 to 04/22/24. Spoke to pharmacy to process.Copay is $4.    PA #/Case ID/Reference #: [id number]

## 2023-04-23 NOTE — Telephone Encounter (Signed)
 Called the pharmacy to fill. Processed through. $4.

## 2023-04-23 NOTE — Telephone Encounter (Signed)
 Pharmacy Patient Advocate Encounter (see bottom for new request info)  Received notification from [facility] that Prior Authorization for Nurtec 75MG  dispersible tablets has been DENIED.  Full denial letter will be uploaded to the media tab. See denial reason below. because we did not see what we need to approve the drug you asked for, (Nurtec ODT 75 milligram tablet).  We may be able to approve this drug when you have tried other drugs first (a trial and failure of or contraindication to 2 or more preferred triptans [rizatriptan tablet / orally disintegrating tablet and sumatriptan  nasal spray / tablet / vial]). We do not see that you have tried these other drugs first, they did not work for you, or they caused you harm.  PA #/Case ID/Reference #: [id number]  Resubmitted due to Dr. Jayme note abt triptans.Past abortive triptans:  contraindicated (hemiplegic migraine)    New Key: [id number] In CoverMyMeds. Same ins [facility]

## 2023-05-10 ENCOUNTER — Ambulatory Visit: Payer: Medicaid Other | Attending: Cardiology | Admitting: Cardiology

## 2023-05-15 ENCOUNTER — Encounter: Payer: Self-pay | Admitting: Cardiology
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
## 2023-05-21 ENCOUNTER — Ambulatory Visit: Payer: Medicaid Other | Admitting: Family Medicine

## 2023-05-21 ENCOUNTER — Telehealth: Payer: Self-pay | Admitting: Neurology

## 2023-05-21 VITALS — BP 138/82 | HR 99 | Ht 65.0 in | Wt 133.0 lb

## 2023-05-21 DIAGNOSIS — F0781 Postconcussional syndrome: Secondary | ICD-10-CM | POA: Diagnosis not present

## 2023-05-21 DIAGNOSIS — G43109 Migraine with aura, not intractable, without status migrainosus: Secondary | ICD-10-CM | POA: Diagnosis not present

## 2023-05-21 HISTORY — DX: Postconcussional syndrome: F07.81

## 2023-05-21 NOTE — Telephone Encounter (Signed)
 Received a message from one of patient's other providers that the Aimovig  is no longer effective.  Please contact patient to confirm.  If Aimovig  is no longer effective, would like to start Emgality . If patient agrees, we can provide sample of starting dose and send in prescription for standing order.

## 2023-05-21 NOTE — Telephone Encounter (Signed)
 LMOVM for patient to call back as well as a mychart message.

## 2023-05-21 NOTE — Patient Instructions (Signed)
 Thank you for coming in today.

## 2023-05-27 ENCOUNTER — Encounter (HOSPITAL_COMMUNITY): Payer: Self-pay

## 2023-05-27 ENCOUNTER — Inpatient Hospital Stay (HOSPITAL_COMMUNITY)
Admission: AD | Admit: 2023-05-27 | Discharge: 2023-05-27 | Disposition: A | Discharge status: Home / Self Care | Payer: Medicaid Other | Attending: Obstetrics & Gynecology | Admitting: Obstetrics & Gynecology

## 2023-05-27 ENCOUNTER — Inpatient Hospital Stay (HOSPITAL_COMMUNITY): Payer: Medicaid Other

## 2023-05-27 ENCOUNTER — Other Ambulatory Visit: Payer: Self-pay

## 2023-05-27 DIAGNOSIS — O3680X0 Pregnancy with inconclusive fetal viability, not applicable or unspecified: Secondary | ICD-10-CM

## 2023-05-27 DIAGNOSIS — O26899 Other specified pregnancy related conditions, unspecified trimester: Secondary | ICD-10-CM

## 2023-05-27 DIAGNOSIS — N939 Abnormal uterine and vaginal bleeding, unspecified: Secondary | ICD-10-CM | POA: Diagnosis not present

## 2023-05-27 DIAGNOSIS — Z3A01 Less than 8 weeks gestation of pregnancy: Secondary | ICD-10-CM | POA: Diagnosis not present

## 2023-05-27 DIAGNOSIS — O26891 Other specified pregnancy related conditions, first trimester: Secondary | ICD-10-CM | POA: Diagnosis not present

## 2023-05-27 DIAGNOSIS — Z3A08 8 weeks gestation of pregnancy: Secondary | ICD-10-CM | POA: Diagnosis not present

## 2023-05-27 DIAGNOSIS — O200 Threatened abortion: Secondary | ICD-10-CM

## 2023-05-27 DIAGNOSIS — O26851 Spotting complicating pregnancy, first trimester: Secondary | ICD-10-CM

## 2023-05-27 DIAGNOSIS — O209 Hemorrhage in early pregnancy, unspecified: Secondary | ICD-10-CM

## 2023-05-27 DIAGNOSIS — Z3490 Encounter for supervision of normal pregnancy, unspecified, unspecified trimester: Secondary | ICD-10-CM

## 2023-05-27 DIAGNOSIS — O09521 Supervision of elderly multigravida, first trimester: Secondary | ICD-10-CM | POA: Diagnosis not present

## 2023-05-27 DIAGNOSIS — D259 Leiomyoma of uterus, unspecified: Secondary | ICD-10-CM

## 2023-05-27 LAB — CBC
HCT: 32 % — ABNORMAL LOW (ref 36.0–46.0)
Hemoglobin: 10.1 g/dL — ABNORMAL LOW (ref 12.0–15.0)
MCH: 23.5 pg — ABNORMAL LOW (ref 26.0–34.0)
MCHC: 31.6 g/dL (ref 30.0–36.0)
MCV: 74.4 fL — ABNORMAL LOW (ref 80.0–100.0)
Platelets: 352 10*3/uL (ref 150–400)
RBC: 4.3 MIL/uL (ref 3.87–5.11)
RDW: 17.2 % — ABNORMAL HIGH (ref 11.5–15.5)
WBC: 5.4 10*3/uL (ref 4.0–10.5)
nRBC: 0 % (ref 0.0–0.2)

## 2023-05-27 LAB — URINALYSIS, ROUTINE W REFLEX MICROSCOPIC
Bilirubin Urine: NEGATIVE
Glucose, UA: NEGATIVE mg/dL
Hgb urine dipstick: NEGATIVE
Ketones, ur: NEGATIVE mg/dL
Leukocytes,Ua: NEGATIVE
Nitrite: NEGATIVE
Protein, ur: NEGATIVE mg/dL
Specific Gravity, Urine: 1.019 (ref 1.005–1.030)
pH: 7 (ref 5.0–8.0)

## 2023-05-27 LAB — HIV ANTIBODY (ROUTINE TESTING W REFLEX): HIV Screen 4th Generation wRfx: NONREACTIVE

## 2023-05-27 LAB — WET PREP, GENITAL
Clue Cells Wet Prep HPF POC: NONE SEEN
Sperm: NONE SEEN
Trich, Wet Prep: NONE SEEN
WBC, Wet Prep HPF POC: >=10 — AB (ref <10)
Yeast Wet Prep HPF POC: NONE SEEN

## 2023-05-27 LAB — HCG, QUANTITATIVE, PREGNANCY: hCG, Beta Chain, Quant, S: 63553 m[IU]/mL — ABNORMAL HIGH (ref <5)

## 2023-05-27 NOTE — MAU Provider Note (Signed)
 History     CSN: [id number]  Arrival date and time: 05/27/23 1047   Event Date/Time   First Provider Initiated Contact with Patient 05/27/23 1228      Chief Complaint  Patient presents with  . Abdominal Pain  . Vaginal Bleeding   HPI  {GYN/OB [id number]}  Past Medical History:  Diagnosis Date  . Fatigue   . Hypokalemia   . Iron deficiency   . Syncope     Past Surgical History:  Procedure Laterality Date  . NO PAST SURGERIES      Family History  Problem Relation Age of Onset  . Diabetes Maternal Grandmother   . Hypertension Maternal Grandmother   . Stroke Maternal Grandfather   . Diabetes Maternal Grandfather   . Hypertension Maternal Grandfather   . Dementia Maternal Grandfather     Social History   Tobacco Use  . Smoking status: Never  . Smokeless tobacco: Never  Vaping Use  . Vaping status: Never Used  Substance Use Topics  . Alcohol use: No  . Drug use: No    Allergies:  Allergies  Allergen Reactions  . Avocado Rash    Medications Prior to Admission  Medication Sig Dispense Refill Last Dose/Taking  . Erenumab-aooe (AIMOVIG) 140 MG/ML SOAJ Inject 140 mg into the skin every 28 (twenty-eight) days. 1.12 mL 11 Past Month  . ondansetron (ZOFRAN) 4 MG tablet Take 1 tablet (4 mg total) by mouth every 6 (six) hours. 16 tablet 0 Past Month  . ALPRAZolam (XANAX) 0.25 MG tablet Take 0.25 mg by mouth 2 (two) times daily as needed for anxiety.     . Blood Pressure Monitoring (BLOOD PRESSURE KIT) DEVI 1 Device by Does not apply route once a week. 1 each 0   . Boric Acid GRAN Place 600 mg vaginally as needed.     . ferrous sulfate 325 (65 FE) MG tablet Take 325 mg by mouth daily at 6 (six) AM.     . propranolol (INDERAL) 10 MG tablet Take 1 tablet (10 mg total) by mouth at bedtime. 90 tablet 3   . Rimegepant Sulfate (NURTEC) 75 MG TBDP Take 1 tablet (75 mg total) by mouth daily as needed. 8 tablet 11   . tiZANidine (ZANAFLEX) 2 MG tablet Take 1 tablet (2 mg  total) by mouth 3 (three) times daily as needed for muscle spasms. (Patient not taking: Reported on 05/21/2023) 50 tablet 5     Review of Systems  Genitourinary:  Positive for pelvic pain. Vaginal bleeding: bleeding stopped. Musculoskeletal: Negative.   Hematological: Negative.   Psychiatric/Behavioral: Negative.     Physical Exam   Blood pressure 110/70, pulse 70, temperature 97.9 F (36.6 C), temperature source Oral, resp. rate 16, last menstrual period 03/26/2023, SpO2 100%, unknown if currently breastfeeding.  Physical Exam Vitals and nursing note reviewed. Exam conducted with a chaperone present.  Constitutional:      Appearance: Normal appearance. She is normal weight.  Abdominal:     General: Abdomen is flat.     Palpations: Abdomen is soft.  Genitourinary:    General: Normal vulva.     Comments: Pelvic exam: External genitalia normal, SE: vaginal walls pink and well rugated, cervix is smooth, pink, no lesions, located on anterior wall of vagina, scant amt of thin, white vaginal d/c with 1 streak of mucoid blood in in -- WP, GC/CT done, cervix visually closed, Uterus is non-tender, no CMT or friability, no adnexal tenderness.  Skin:    General: Skin  is warm and dry.  Neurological:     Mental Status: She is alert and oriented to person, place, and time.  Psychiatric:        Mood and Affect: Mood normal.        Behavior: Behavior normal.        Thought Content: Thought content normal.        Judgment: Judgment normal.   MAU Course  Procedures  MDM CCUA UPT CBC ABO/Rh -- not drawn; known O POS HCG Wet Prep GC/CT -- Results pending  RPR -- Results pending  OB U/S < 14 wks TVUS  Results for orders placed or performed during the hospital encounter of 05/27/23 (from the past 24 hours)  Urinalysis, Routine w reflex microscopic -Urine, Clean Catch     Status: Abnormal   Collection Time: 05/27/23 11:02 AM  Result Value Ref Range   Color, Urine YELLOW YELLOW    APPearance HAZY (A) CLEAR   Specific Gravity, Urine 1.019 1.005 - 1.030   pH 7.0 5.0 - 8.0   Glucose, UA NEGATIVE NEGATIVE mg/dL   Hgb urine dipstick NEGATIVE NEGATIVE   Bilirubin Urine NEGATIVE NEGATIVE   Ketones, ur NEGATIVE NEGATIVE mg/dL   Protein, ur NEGATIVE NEGATIVE mg/dL   Nitrite NEGATIVE NEGATIVE   Leukocytes,Ua NEGATIVE NEGATIVE    No results found.   Assessment and Plan  ***  Raelyn Mora, CNM 05/27/2023, 12:55 PM

## 2023-05-27 NOTE — Discharge Instructions (Addendum)
Return to MAU: If you have heavier bleeding that soaks through more that 2 pads per hour for an hour or more If you bleed so much that you feel like you might pass out or you do pass out If you have significant abdominal pain that is not improved with Tylenol 1000 mg every 8 hours as needed for pain If you develop a fever > 100.5    Safe Medications in Pregnancy   Acne: Benzoyl Peroxide Salicylic Acid  Backache/Headache: Tylenol: 2 regular strength every 4 hours OR              2 Extra strength every 6 hours  Colds/Coughs/Allergies: Benadryl (alcohol free) 25 mg every 6 hours as needed Breath right strips Claritin Cepacol throat lozenges Chloraseptic throat spray Cold-Eeze- up to three times per day Cough drops, alcohol free Flonase (by prescription only) Guaifenesin Mucinex Robitussin DM (plain only, alcohol free) Saline nasal spray/drops Sudafed (pseudoephedrine) & Actifed ** use only after [redacted] weeks gestation and if you do not have high blood pressure Tylenol Vicks Vaporub Zinc lozenges Zyrtec   Constipation: Colace Ducolax suppositories Fleet enema Glycerin suppositories Metamucil Milk of magnesia Miralax Senokot Smooth move tea  Diarrhea: Kaopectate Imodium A-D  *NO pepto Bismol  Hemorrhoids: Anusol Anusol HC Preparation H Tucks  Indigestion: Tums Maalox Mylanta Zantac  Pepcid  Insomnia: Benadryl (alcohol free) 25mg every 6 hours as needed Tylenol PM Unisom, no Gelcaps  Leg Cramps: Tums MagGel  Nausea/Vomiting:  Bonine Dramamine Emetrol Ginger extract Sea bands Meclizine  Nausea medication to take during pregnancy:  Unisom (doxylamine succinate 25 mg tablets) Take one tablet daily at bedtime. If symptoms are not adequately controlled, the dose can be increased to a maximum recommended dose of two tablets daily (1/2 tablet in the morning, 1/2 tablet mid-afternoon and one at bedtime). Vitamin B6 100mg tablets. Take one tablet twice  a day (up to 200 mg per day).  Skin Rashes: Aveeno products Benadryl cream or 25mg every 6 hours as needed Calamine Lotion 1% cortisone cream  Yeast infection: Gyne-lotrimin 7 Monistat 7   **If taking multiple medications, please check labels to avoid duplicating the same active ingredients **take medication as directed on the label ** Do not exceed 4000 mg of tylenol in 24 hours **Do not take medications that contain aspirin or ibuprofen    

## 2023-05-27 NOTE — MAU Note (Signed)
.  Marcia Hanson is a 43 y.o. at [redacted]w[redacted]d here in MAU reporting: she passed 2 golf ball sized clots about an hour ago.  Pt reports bleeding has lightened up since then but she is still cramping.  States cramping started with the bleeding.    LMP: 12/16 Onset of complaint: 1 hour Pain score: 6 Vitals:   05/27/23 1113 05/27/23 1114  BP:  110/70  Pulse:  70  Resp:  16  Temp:  97.9 F (36.6 C)  SpO2: 100%    Lab orders placed from triage: ua

## 2023-05-28 ENCOUNTER — Encounter: Payer: Self-pay | Admitting: Family Medicine

## 2023-05-28 LAB — GC/CHLAMYDIA PROBE AMP (~~LOC~~) NOT AT ARMC
Chlamydia: NEGATIVE
Comment: NEGATIVE
Comment: NORMAL
Neisseria Gonorrhea: NEGATIVE

## 2023-05-28 LAB — RPR: RPR Ser Ql: NONREACTIVE

## 2023-06-06 ENCOUNTER — Other Ambulatory Visit: Payer: Self-pay

## 2023-06-06 ENCOUNTER — Telehealth: Payer: Self-pay

## 2023-06-06 ENCOUNTER — Other Ambulatory Visit: Payer: Medicaid Other

## 2023-06-06 DIAGNOSIS — O3680X0 Pregnancy with inconclusive fetal viability, not applicable or unspecified: Secondary | ICD-10-CM

## 2023-06-06 NOTE — Telephone Encounter (Signed)
 Patient called RN line regarding her appointment this morning. Patient reported that she is not able to come in today because she has the stomach bug and is not able to come in today and would like to reschedule her appointment   Marcelino Duster, RN

## 2023-06-07 ENCOUNTER — Other Ambulatory Visit: Payer: Self-pay

## 2023-06-07 ENCOUNTER — Ambulatory Visit (INDEPENDENT_AMBULATORY_CARE_PROVIDER_SITE_OTHER): Payer: Medicaid Other

## 2023-06-07 DIAGNOSIS — Z3A01 Less than 8 weeks gestation of pregnancy: Secondary | ICD-10-CM | POA: Diagnosis not present

## 2023-06-07 DIAGNOSIS — O0289 Other abnormal products of conception: Secondary | ICD-10-CM | POA: Diagnosis not present

## 2023-06-07 DIAGNOSIS — O3680X0 Pregnancy with inconclusive fetal viability, not applicable or unspecified: Secondary | ICD-10-CM

## 2023-06-07 DIAGNOSIS — Z3A00 Weeks of gestation of pregnancy not specified: Secondary | ICD-10-CM | POA: Diagnosis not present

## 2023-06-07 NOTE — Progress Notes (Unsigned)
 GYNECOLOGY OFFICE VISIT NOTE  History:   Marcia Hanson is a 43 y.o. [id number] here today for [place] result visit.  Patient seen in MAU on 05/27/2023 with US showing IUP with enlarged yolk sac and no fetal pole, findings suspicious for failed pregnancy Ultrasound today shows findings definitive for failed pregnancy  Health Maintenance Due  Topic Date Due   DTaP/Tdap/Td (1 - Tdap) Never done   INFLUENZA VACCINE  Never done   COVID-19 Vaccine (4 - 2024-25 season) 12/10/2022    Past Medical History:  Diagnosis Date   Fatigue    Hypokalemia    Iron deficiency    Syncope     Past Surgical History:  Procedure Laterality Date   NO PAST SURGERIES      The following portions of the patient's history were reviewed and updated as appropriate: allergies, current medications, past family history, past medical history, past social history, past surgical history and problem list.   Health Maintenance:   Last pap: Result Date Procedure Results Follow-ups  11/24/2020 Pap IG and HPV (high risk) DNA detection Pap Smear: NILM     Last mammogram:     Review of Systems:  Pertinent items noted in HPI and remainder of comprehensive ROS otherwise negative.  Physical Exam:  LMP 03/26/2023 (Exact Date)  CONSTITUTIONAL: Well-developed, well-nourished female in no acute distress.  HEENT:  Normocephalic, atraumatic. External right and left ear normal. No scleral icterus.  NECK: Normal range of motion, supple, no masses noted on observation SKIN: No rash noted. Not diaphoretic. No erythema. No pallor. MUSCULOSKELETAL: Normal range of motion. No edema noted. NEUROLOGIC: Alert and oriented to person, place, and time. Normal muscle tone coordination.  PSYCHIATRIC: Normal mood and affect. Normal behavior. Normal judgment and thought content. RESPIRATORY: Effort normal, no problems with respiration noted   Labs and Imaging No results found for this or any previous visit (from the past week). US  OB LESS THAN 14 WEEKS WITH OB TRANSVAGINAL Result Date: 06/08/2023 ----------------------------------------------------------------------  OBSTETRICS REPORT                       (Signed Final 06/08/2023 12:13 pm) ---------------------------------------------------------------------- Patient Info  ID #:       [id number]                          D.O.B.:  03/08/1981 (42 yrs)(F)  Name:       Marcia Hanson             Visit Date: 06/07/2023 03:07 pm ---------------------------------------------------------------------- Performed By  Attending:        Merian Capron MD     Ref. Address:     [address]                                                             [place], [place]                                                             [address]  Performed By:  Berline Chough         [facility]                                                             [facility]                                                             [facility]                                                             [facility]  Referred By:      [facility]                    for [facility] ---------------------------------------------------------------------- Orders  #  Description                           Code        Ordered By  1  US OB LESS THAN 14 WEEKS              [id number]     Raelyn Mora     WITH OB TRANSVAGINAL ----------------------------------------------------------------------  #  Order #                     Accession #                Episode #  1  [id number]                   [id number]                 [id number] ---------------------------------------------------------------------- Indications  Weeks of gestation of pregnancy not            Z3A.00  specified  Pregnancy with inconclusive fetal viability    O36.80X0 ---------------------------------------------------------------------- Fetal Evaluation  Num Of Fetuses:         1  Preg.  Location:         Intrauterine  Gest. Sac:              Intrauterine  Yolk Sac:               Appears enlarged  Fetal Pole:             Visualized  Cardiac Activity:       Absent ---------------------------------------------------------------------- 1st Trimester Genetic Sonogram Screening  CRL:  5.7  mm    G. Age:   6w 2d                  EDD:   01/29/24 ---------------------------------------------------------------------- Myomas  Site                     L(cm)      W(cm)      D(cm)       Location  Right                    2.2        2.7        1.7  Right                    2          1.8        1.3 ----------------------------------------------------------------------  Blood Flow                  RI       PI       Comments ---------------------------------------------------------------------- Impression  Irregular gestational sac, enarlged yolk sac, fetal pole without  any cardiac activity.  Findings are definitive for nonviable pregnancy. ---------------------------------------------------------------------- Recommendations  Follow up with provider to discuss options for management. ----------------------------------------------------------------------               Merian Capron, MD Electronically Signed Final Report   06/08/2023 12:13 pm ----------------------------------------------------------------------   US OB LESS THAN 14 WEEKS WITH OB TRANSVAGINAL Result Date: 05/27/2023 CLINICAL DATA:  Vaginal bleeding and cramping today. EXAM: OBSTETRIC <14 WK [place] AND TRANSVAGINAL OB US TECHNIQUE: Both transabdominal and transvaginal ultrasound examinations were performed for complete evaluation of the gestation as well as the maternal uterus, adnexal regions, and pelvic cul-de-sac. Transvaginal technique was performed to assess early pregnancy. COMPARISON:  None Available. FINDINGS: Intrauterine gestational sac: Present Yolk sac:  Present.  This measures 6.7 mm (normal 3-5 mm). Embryo:  None Cardiac  Activity: N/A Heart Rate: N/A bpm MSD: 28.1 mm   7 w   6 d Subchorionic hemorrhage:  None visualized. Maternal uterus/adnexae: Uterine fibroids are noted. The ovaries are unremarkable. IMPRESSION: Findings are highly suspicious but not yet definitive for failed pregnancy. Recommend follow-up US in 10-14 days for definitive diagnosis. This recommendation follows SRU consensus guidelines: Diagnostic Criteria for Nonviable Pregnancy Early in the First Trimester. Malva Limes Med 2013; 295:6213-08. Electronically Signed   By: Rudie Meyer M.D.   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 Reviewed options of expectant, medical, or surgical management. After counseling she remained undecided though she did not she has had failed medication management in the past and was leaning towards D&C. She will send a message once she has made a decision.   Blood type O positive, rhogam was not indicated. We discussed return precautions including crescendo abdominal pain, heavy vaginal bleeding soaking >1 pad/hour, and fever.   Follow up pending patient's decision  Total face-to-face time with patient: 15 minutes.  Over 50% of encounter was  spent on counseling and coordination of care.   Venora Maples, MD/MPH Attending Family Medicine Physician, [facility] for [facility], [facility]

## 2023-06-11 ENCOUNTER — Encounter: Payer: Self-pay | Admitting: Family Medicine

## 2023-06-11 ENCOUNTER — Telehealth: Payer: Self-pay | Admitting: Family Medicine

## 2023-06-11 DIAGNOSIS — O039 Complete or unspecified spontaneous abortion without complication: Secondary | ICD-10-CM

## 2023-06-11 NOTE — Telephone Encounter (Signed)
 Patient was told to to send a mychart message to Dr.Eckstat once she decided on her treatment plan regarding her non viable pregnancy, she is not able to send message so she is calling to tell him that she has decided on the pill procedure. Pharmacy is [facility] in [facility].

## 2023-06-12 MED ORDER — OXYCODONE HCL 5 MG PO TABS: 5.0000 mg | ORAL_TABLET | ORAL | 0 refills | Status: DC | PRN

## 2023-06-12 MED ORDER — MISOPROSTOL 200 MCG PO TABS: ORAL_TABLET | ORAL | 1 refills | Status: DC

## 2023-06-12 MED ORDER — ONDANSETRON 4 MG PO TBDP: 4.0000 mg | ORAL_TABLET | Freq: Four times a day (QID) | ORAL | 0 refills | Status: DC | PRN

## 2023-06-12 NOTE — Telephone Encounter (Signed)
 Meds sent to pharmacy and message sent to patient through MyChart.   Please schedule patient for follow up visit in 2 weeks.

## 2023-06-13 ENCOUNTER — Other Ambulatory Visit: Payer: Self-pay | Admitting: Neurology

## 2023-06-13 ENCOUNTER — Inpatient Hospital Stay (HOSPITAL_COMMUNITY)
Admission: AD | Admit: 2023-06-13 | Discharge: 2023-06-14 | Disposition: A | Discharge status: Home / Self Care | Attending: Obstetrics and Gynecology | Admitting: Obstetrics and Gynecology

## 2023-06-13 DIAGNOSIS — O209 Hemorrhage in early pregnancy, unspecified: Secondary | ICD-10-CM

## 2023-06-13 DIAGNOSIS — Z3A11 11 weeks gestation of pregnancy: Secondary | ICD-10-CM

## 2023-06-13 DIAGNOSIS — O021 Missed abortion: Secondary | ICD-10-CM | POA: Insufficient documentation

## 2023-06-13 DIAGNOSIS — O039 Complete or unspecified spontaneous abortion without complication: Secondary | ICD-10-CM

## 2023-06-13 MED ORDER — EMGALITY 120 MG/ML ~~LOC~~ SOAJ: 120.0000 mg | SUBCUTANEOUS | 11 refills | Status: DC

## 2023-06-13 NOTE — MAU Note (Signed)
..  Marcia Hanson is a 43 y.o. at [redacted]w[redacted]d here in MAU reporting: since yesterday has been feeling weak and dizzy, could not go down stairs at home.  Vaginal bleeding since yesterday  Took cytotec pills around noon yesterday.  Reports lower abdominal pain   Pain score: 8/10 Vitals:   06/13/23 2256  BP: 129/85  Pulse: 84  Resp: 20  Temp: 97.8 F (36.6 C)  SpO2: 100%     FHT:n/a Lab orders placed from triage:none

## 2023-06-14 ENCOUNTER — Inpatient Hospital Stay (HOSPITAL_COMMUNITY)

## 2023-06-14 DIAGNOSIS — O039 Complete or unspecified spontaneous abortion without complication: Secondary | ICD-10-CM

## 2023-06-14 DIAGNOSIS — Z3A11 11 weeks gestation of pregnancy: Secondary | ICD-10-CM | POA: Diagnosis not present

## 2023-06-14 DIAGNOSIS — O021 Missed abortion: Secondary | ICD-10-CM | POA: Diagnosis present

## 2023-06-14 LAB — CBC
HCT: 18.4 % — ABNORMAL LOW (ref 36.0–46.0)
HCT: 27.2 % — ABNORMAL LOW (ref 36.0–46.0)
Hemoglobin: 5.8 g/dL — CL (ref 12.0–15.0)
Hemoglobin: 8.8 g/dL — ABNORMAL LOW (ref 12.0–15.0)
MCH: 24 pg — ABNORMAL LOW (ref 26.0–34.0)
MCH: 24.9 pg — ABNORMAL LOW (ref 26.0–34.0)
MCHC: 31.5 g/dL (ref 30.0–36.0)
MCHC: 32.4 g/dL (ref 30.0–36.0)
MCV: 76 fL — ABNORMAL LOW (ref 80.0–100.0)
MCV: 77.1 fL — ABNORMAL LOW (ref 80.0–100.0)
Platelets: 295 10*3/uL (ref 150–400)
Platelets: 259 10*3/uL (ref 150–400)
RBC: 2.42 MIL/uL — ABNORMAL LOW (ref 3.87–5.11)
RBC: 3.53 MIL/uL — ABNORMAL LOW (ref 3.87–5.11)
RDW: 17.2 % — ABNORMAL HIGH (ref 11.5–15.5)
RDW: 17.2 % — ABNORMAL HIGH (ref 11.5–15.5)
WBC: 6.4 10*3/uL (ref 4.0–10.5)
WBC: 6.8 10*3/uL (ref 4.0–10.5)
nRBC: 0 % (ref 0.0–0.2)
nRBC: 0 % (ref 0.0–0.2)

## 2023-06-14 LAB — PREPARE RBC (CROSSMATCH)

## 2023-06-14 MED ORDER — SODIUM CHLORIDE 0.9 % IV SOLN: INTRAVENOUS | Status: DC

## 2023-06-14 MED ORDER — ACETAMINOPHEN 500 MG PO TABS
1000.0000 mg | ORAL_TABLET | Freq: Once | ORAL | Status: AC
  Administered 2023-06-14: 1000 mg via ORAL
  Filled 2023-06-14: qty 2

## 2023-06-14 MED ORDER — SODIUM CHLORIDE 0.9% IV SOLUTION: Freq: Once | INTRAVENOUS | Status: AC

## 2023-06-14 MED ORDER — IBUPROFEN 600 MG PO TABS
600.0000 mg | ORAL_TABLET | Freq: Once | ORAL | Status: AC
  Administered 2023-06-14: 600 mg via ORAL
  Filled 2023-06-14: qty 1

## 2023-06-14 NOTE — MAU Provider Note (Addendum)
 Chief Complaint: Vaginal Bleeding and Abdominal Pain   Event Date/Time   First Provider Initiated Contact with Patient 06/14/23 0153        SUBJECTIVE HPI: Marcia Hanson is a 43 y.o. [id number] at [redacted]w[redacted]d by LMP who presents to maternity admissions reporting having heavy bleeding yesterday and today after taking cytotec for missed abortion.  Since then has felt lightheaded and weak. . She denies h/a, n/v, or fever/chills.     Vaginal Bleeding The patient's primary symptoms include pelvic pain and vaginal bleeding. Associated symptoms include abdominal pain and back pain. Pertinent negatives include no fever or nausea. The vaginal discharge was bloody. The vaginal bleeding is heavier than menses. She has been passing clots. She has been passing tissue.  Dizziness This is a new problem. The current episode started yesterday. The problem occurs constantly. Associated symptoms include abdominal pain. Pertinent negatives include no fever, myalgias or nausea. The symptoms are aggravated by standing and walking.   RN Note: Marcia Hanson is a 43 y.o. at [redacted]w[redacted]d here in MAU reporting: since yesterday has been feeling weak and dizzy, could not go down stairs at home.  Vaginal bleeding since yesterday  Took cytotec pills around noon yesterday.  Reports lower abdominal pain   Pain score: 8/10  Past Medical History:  Diagnosis Date   Fatigue    Hypokalemia    Iron deficiency    Syncope    Past Surgical History:  Procedure Laterality Date   NO PAST SURGERIES     Social History   Socioeconomic History   Marital status: Legally Separated    Spouse name: Not on file   Number of children: Not on file   Years of education: Not on file   Highest education level: Not on file  Occupational History   Not on file  Tobacco Use   Smoking status: Never   Smokeless tobacco: Never  Vaping Use   Vaping status: Never Used  Substance and Sexual Activity   Alcohol use: No   Drug use: No    Sexual activity: Not Currently  Other Topics Concern   Not on file  Social History Narrative   Not on file   Social Drivers of Health   Financial Resource Strain: Low Risk  (05/08/2023)   [facility]   Overall Financial Resource Strain (CARDIA)    Difficulty of Paying Living Expenses: Not hard at all  Food Insecurity: No Food Insecurity (05/08/2023)   Received from [facility]   Hunger Vital Sign    Worried About Running Out of Food in the Last Year: Never true    Ran Out of Food in the Last Year: Never true  Transportation Needs: No Transportation Needs (05/08/2023)   Received from [facility] - Transportation    Lack of Transportation (Medical): No    Lack of Transportation (Non-Medical): No  Physical Activity: Sufficiently Active (06/21/2022)   [facility]   Exercise Vital Sign    Days of Exercise per Week: 6 days    Minutes of Exercise per Session: 50 min  Stress: No Stress Concern Present (06/21/2022)   Received from [facility] - Occupational Stress Questionnaire    Feeling of Stress : Only a little  Social Connections: Socially Integrated (06/21/2022)   Received from [facility]   Social Network    How would you rate your social network (family, work, friends)?: Good  participation with social networks  Intimate Partner Violence: Not At Risk (08/28/2022)   Humiliation, Afraid, Rape, and Kick questionnaire    Fear of Current or Ex-Partner: No    Emotionally Abused: No    Physically Abused: No    Sexually Abused: No   No current facility-administered medications on file prior to encounter.   Current Outpatient Medications on File Prior to Encounter  Medication Sig Dispense Refill   Blood Pressure Monitoring (BLOOD PRESSURE KIT) DEVI 1 Device by Does not apply route once a week. 1 each 0   Galcanezumab-gnlm (EMGALITY) 120 MG/ML SOAJ  Inject 120 mg into the skin every 28 (twenty-eight) days. 1.12 mL 11   misoprostol (CYTOTEC) 200 MCG tablet Place four tablets in between your gums and cheeks (two tablets on each side) and allow them to dissolve completely 4 tablet 1   ondansetron (ZOFRAN) 4 MG tablet Take 1 tablet (4 mg total) by mouth every 6 (six) hours. 16 tablet 0   ondansetron (ZOFRAN-ODT) 4 MG disintegrating tablet Take 1 tablet (4 mg total) by mouth every 6 (six) hours as needed for nausea. 20 tablet 0   oxyCODONE (OXY IR/ROXICODONE) 5 MG immediate release tablet Take 1 tablet (5 mg total) by mouth every 4 (four) hours as needed for severe pain (pain score 7-10) or breakthrough pain. 4 tablet 0   Allergies  Allergen Reactions   Avocado Rash    I have reviewed patient's Past Medical Hx, Surgical Hx, Family Hx, Social Hx, medications and allergies.   ROS:  Review of Systems  Constitutional:  Negative for fever.  Gastrointestinal:  Positive for abdominal pain. Negative for nausea.  Genitourinary:  Positive for pelvic pain and vaginal bleeding.  Musculoskeletal:  Positive for back pain. Negative for myalgias.  Neurological:  Positive for dizziness.   Review of Systems  Other systems negative   Physical Exam  Physical Exam Patient Vitals for the past 24 hrs:  BP Temp Temp src Pulse Resp SpO2 Height Weight  06/13/23 2256 129/85 97.8 F (36.6 C) Oral 84 20 100 % 5\' 5"  (1.651 m) 61.1 kg   Constitutional: Well-developed, well-nourished female in no acute distress.  Cardiovascular: normal rate Respiratory: normal effort GI: Abd soft, non-tender. MS: Extremities nontender, no edema, normal ROM Neurologic: Alert and oriented x 4.  GU: Neg CVAT.  PELVIC EXAM: several small clots removed from vault  No active hemorrhage Cervix slightly open   LAB RESULTS Results for orders placed or performed during the hospital encounter of 06/13/23 (from the past 24 hours)  CBC     Status: Abnormal   Collection Time:  06/14/23  1:51 AM  Result Value Ref Range   WBC 6.4 4.0 - 10.5 K/uL   RBC 2.42 (L) 3.87 - 5.11 MIL/uL   Hemoglobin 5.8 (LL) 12.0 - 15.0 g/dL   HCT 16.1 (L) 09.6 - 04.5 %   MCV 76.0 (L) 80.0 - 100.0 fL   MCH 24.0 (L) 26.0 - 34.0 pg   MCHC 31.5 30.0 - 36.0 g/dL   RDW 40.9 (H) 81.1 - 91.4 %   Platelets 295 150 - 400 K/uL   nRBC 0.0 0.0 - 0.2 %  Prepare RBC (crossmatch)     Status: None   Collection Time: 06/14/23  3:27 AM  Result Value Ref Range   Order Confirmation      ORDER PROCESSED BY BLOOD BANK Performed at [facility], [address], [place], [place] [address]   Type and screen [facility]     Status: None (Preliminary result)   Collection Time: 06/14/23  3:27 AM  Result Value Ref Range   ABO/RH(D) O POS    Antibody Screen NEG    Sample Expiration 06/17/2023,2359    Unit Number [id number]    Blood Component Type RED CELLS,LR    Unit division 00    Status of Unit ISSUED    Transfusion Status OK TO TRANSFUSE    Crossmatch Result      Compatible Performed at [facility], [address] [place], [place] [address]    Unit Number [id number]    Blood Component Type RED CELLS,LR    Unit division 00    Status of Unit ALLOCATED    Transfusion Status OK TO TRANSFUSE    Crossmatch Result Compatible       IMAGING US OB Transvaginal Result Date: 06/14/2023 CLINICAL DATA:  Missed abortion. Cytotec given 06/13/2023. Heavy vaginal bleeding. Rule out products of conception. Beta HCG pending. LMP 03/26/2023 EXAM: TRANSVAGINAL OB ULTRASOUND TECHNIQUE: Transvaginal ultrasound was performed for complete evaluation of the gestation as well as the maternal uterus, adnexal regions, and pelvic cul-de-sac. COMPARISON:  Ultrasound 05/27/2023 FINDINGS: Intrauterine gestational sac: None Yolk sac:  Not Visualized. Embryo:  Not Visualized. Cardiac Activity: Not Visualized. Maternal uterus/adnexae: Normal ovaries. Thickened endometrium measuring 12 mm. There is  some associated vascularity anteriorly. No free fluid in the pelvis. IMPRESSION: No gestational sac or fetal pole is visualized. Thickened endometrium with some associated vascularity anteriorly. Findings are concerning for retained products of conception. Electronically Signed   By: Minerva Fester M.D.   On: 06/14/2023 02:33     MAU Management/MDM: I have reviewed the triage vital signs and the nursing notes.   Pertinent labs & imaging results that were available during my care of the patient were reviewed by me and considered in my medical decision making (see chart for details).      I have reviewed her medical records including past results, notes and treatments. Medical, Surgical, and family history were reviewed.  Medications and recent lab tests were reviewed  Consult Dr Para March with presentation, exam findings, and results.  She recommends transfusion of 2 units.  Treatments in MAU included Blood transfusion (2 units PRBCs).    ASSESSMENT Status post missed abortion treated with Cytotec evacuation Blood loss anemia  PLAN Undergoing transfusion in MAU due to lack of beds Report given to oncoming provider  Wynelle Bourgeois CNM, MSN Certified Nurse-Midwife 06/14/2023  1:53 AM  Update On reevaluation patient was reporting cramping and would like something for the pain.  Given Tylenol with great resolution.  Patient received 2 unit transfusion and repeat CBC showed improvement of hemoglobin to 8.8.  Patient reports that she feels much better and is ready to go home.  Discussed tricked return precautions.  Patient will follow-up in the clinic in approximately 2 weeks.  She had no further questions or concerns.

## 2023-06-14 NOTE — Discharge Instructions (Signed)
 It was a pleasure taking care of you today.  I am sorry for your loss!  Your blood levels were very low so he received 2 units of blood.  I am glad that the bleeding has decreased and you are feeling better.  Please continue to take Tylenol and ibuprofen every 6-8 hours as needed.  I sent a message to our clinic to have you scheduled for a follow-up visit in approximately 2 weeks.  If your pain worsens, your bleeding worsens or you have any other concerns please return for further evaluation.

## 2023-06-14 NOTE — MAU Note (Signed)
 CRITICAL VALUE STICKER  CRITICAL VALUE: Hgb 5.8  RECEIVER (on-site recipient of call):Vallie Teters RN  DATE & TIME NOTIFIED: 0227  MESSENGER (representative from lab): Rivka Barbara  MD NOTIFIED: Wynelle Bourgeois, CNM   TIME OF NOTIFICATION: 0240  RESPONSE:  Orders placed

## 2023-06-15 LAB — BPAM RBC
Blood Product Expiration Date: 202503132359
Blood Product Expiration Date: 202503132359
ISSUE DATE / TIME: 202503060535
ISSUE DATE / TIME: 202503060903
Unit Type and Rh: 5100
Unit Type and Rh: 5100

## 2023-06-15 LAB — TYPE AND SCREEN
ABO/RH(D): O POS
Antibody Screen: NEGATIVE
Unit division: 0
Unit division: 0

## 2023-06-19 ENCOUNTER — Telehealth: Payer: Self-pay | Admitting: Family Medicine

## 2023-06-19 NOTE — Telephone Encounter (Signed)
 Left voice message for patient to call the office to get scheduled for f/u appt (SAB)

## 2023-06-20 ENCOUNTER — Telehealth: Payer: Self-pay | Admitting: Pharmacist

## 2023-06-20 ENCOUNTER — Telehealth: Payer: Self-pay

## 2023-06-20 ENCOUNTER — Other Ambulatory Visit (HOSPITAL_COMMUNITY): Payer: Self-pay

## 2023-06-20 NOTE — Telephone Encounter (Signed)
 Pharmacy Patient Advocate Encounter   Received notification from Physician's Office that prior authorization for Emgality 120MG /ML auto-injectors (migraine) is required/requested.   Insurance verification completed.   The patient is insured through [facility] .   Per test claim: PA required; PA submitted to above mentioned insurance via CoverMyMeds Key/confirmation #/EOC [id number] Status is pending

## 2023-06-20 NOTE — Telephone Encounter (Signed)
 PA needed for Emgality 120 mg

## 2023-06-20 NOTE — Telephone Encounter (Signed)
 Pharmacy Patient Advocate Encounter  Received notification from [facility] that Prior Authorization for Emgality 120MG /ML auto-injectors (migraine) has been APPROVED from 06/20/2023 to 09/18/2023   PA #/Case ID/Reference #: [id number]

## 2023-07-04 ENCOUNTER — Ambulatory Visit: Admitting: Obstetrics and Gynecology

## 2023-07-09 ENCOUNTER — Ambulatory Visit: Admitting: Obstetrics

## 2023-08-21 ENCOUNTER — Other Ambulatory Visit (HOSPITAL_COMMUNITY): Payer: Self-pay

## 2023-08-22 ENCOUNTER — Other Ambulatory Visit (HOSPITAL_COMMUNITY): Payer: Self-pay

## 2023-08-22 ENCOUNTER — Telehealth: Payer: Self-pay

## 2023-08-22 NOTE — Telephone Encounter (Signed)
*  [facility]  Pharmacy Patient Advocate Encounter   Received notification from CoverMyMeds that prior authorization for Emgality  120MG /ML auto-injectors (migraine)  is required/requested.   Insurance verification completed.   The patient is insured through [facility] .   Per test claim: PA required; However, NEW/RECENT labs/notes are needed to complete & submit PA request. Please see below.   CMM Key: [id number] RER

## 2023-08-23 NOTE — Telephone Encounter (Signed)
 Per Dr.Jaffe, Please contact patient for update.  If this is going to cause a delay where she is over a week past her scheduled injection, I want to give her a sample.  Her follow up is on 6/3.    Mychart message sent to patient.
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
## 2023-09-11 ENCOUNTER — Encounter: Payer: Self-pay | Admitting: Neurology

## 2023-09-11 ENCOUNTER — Ambulatory Visit: Payer: Medicaid Other | Admitting: Neurology

## 2023-09-11 VITALS — BP 138/74 | HR 68 | Ht 65.0 in | Wt 136.0 lb

## 2023-09-11 DIAGNOSIS — G43009 Migraine without aura, not intractable, without status migrainosus: Secondary | ICD-10-CM

## 2023-09-11 NOTE — Progress Notes (Signed)
 Medication Samples have been provided to the patient.  Drug name: Marcia Hanson       Strength: 100 mg        Qty: 4  LOT: [id number]  Exp.Date: 12/26  Dosing instructions: as needed  The patient has been instructed regarding the correct time, dose, and frequency of taking this medication, including desired effects and most common side effects.   [name] 10:13 AM 09/11/2023

## 2023-09-11 NOTE — Patient Instructions (Addendum)
 Take Emgality  injection.  If no improvement in 2 weeks, contact me and we can change to Ajovy Take Ubrelvy at earliest onset of migraine.  May repeat in 2 hours (maximum 2 tablets in 24 hours).  Let me know if it works Limit use of pain relievers to no more than 9 days out of the month to prevent risk of rebound or medication-overuse headache. Keep headache diary Follow up 6 months.

## 2023-10-15 ENCOUNTER — Other Ambulatory Visit: Payer: Self-pay

## 2023-10-15 ENCOUNTER — Encounter (HOSPITAL_COMMUNITY): Payer: Self-pay | Admitting: Emergency Medicine

## 2023-10-15 ENCOUNTER — Ambulatory Visit (HOSPITAL_COMMUNITY)
Admission: EM | Admit: 2023-10-15 | Discharge: 2023-10-15 | Disposition: A | Discharge status: Home / Self Care | Attending: Physician Assistant | Admitting: Physician Assistant

## 2023-10-15 ENCOUNTER — Ambulatory Visit (HOSPITAL_COMMUNITY): Payer: Self-pay | Admitting: Physician Assistant

## 2023-10-15 DIAGNOSIS — R5383 Other fatigue: Secondary | ICD-10-CM | POA: Insufficient documentation

## 2023-10-15 DIAGNOSIS — Z3202 Encounter for pregnancy test, result negative: Secondary | ICD-10-CM | POA: Diagnosis not present

## 2023-10-15 DIAGNOSIS — R001 Bradycardia, unspecified: Secondary | ICD-10-CM | POA: Insufficient documentation

## 2023-10-15 DIAGNOSIS — R5381 Other malaise: Secondary | ICD-10-CM | POA: Insufficient documentation

## 2023-10-15 DIAGNOSIS — R42 Dizziness and giddiness: Secondary | ICD-10-CM | POA: Diagnosis present

## 2023-10-15 LAB — COMPREHENSIVE METABOLIC PANEL WITH GFR
ALT: 13 U/L (ref 0–44)
AST: 16 U/L (ref 15–41)
Albumin: 3.8 g/dL (ref 3.5–5.0)
Alkaline Phosphatase: 48 U/L (ref 38–126)
Anion gap: 7 (ref 5–15)
BUN: 8 mg/dL (ref 6–20)
CO2: 26 mmol/L (ref 22–32)
Calcium: 9.1 mg/dL (ref 8.9–10.3)
Chloride: 103 mmol/L (ref 98–111)
Creatinine, Ser: 0.73 mg/dL (ref 0.44–1.00)
GFR, Estimated: >60 mL/min (ref >60)
Glucose, Bld: 92 mg/dL (ref 70–99)
Potassium: 3.9 mmol/L (ref 3.5–5.1)
Sodium: 136 mmol/L (ref 135–145)
Total Bilirubin: 0.6 mg/dL (ref 0.0–1.2)
Total Protein: 7.6 g/dL (ref 6.5–8.1)

## 2023-10-15 LAB — POCT URINALYSIS DIP (MANUAL ENTRY)
Bilirubin, UA: NEGATIVE
Glucose, UA: NEGATIVE mg/dL
Ketones, POC UA: NEGATIVE mg/dL
Leukocytes, UA: NEGATIVE
Nitrite, UA: NEGATIVE
Protein Ur, POC: NEGATIVE mg/dL
Spec Grav, UA: 1.025 (ref 1.010–1.025)
Urobilinogen, UA: 0.2 U/dL
pH, UA: 6 (ref 5.0–8.0)

## 2023-10-15 LAB — CBC WITH DIFFERENTIAL/PLATELET
Abs Immature Granulocytes: 0.02 K/uL (ref 0.00–0.07)
Basophils Absolute: 0.1 K/uL (ref 0.0–0.1)
Basophils Relative: 2 %
Eosinophils Absolute: 0.3 K/uL (ref 0.0–0.5)
Eosinophils Relative: 7 %
HCT: 33.8 % — ABNORMAL LOW (ref 36.0–46.0)
Hemoglobin: 10.4 g/dL — ABNORMAL LOW (ref 12.0–15.0)
Immature Granulocytes: 0 %
Lymphocytes Relative: 36 %
Lymphs Abs: 1.7 K/uL (ref 0.7–4.0)
MCH: 22.7 pg — ABNORMAL LOW (ref 26.0–34.0)
MCHC: 30.8 g/dL (ref 30.0–36.0)
MCV: 73.8 fL — ABNORMAL LOW (ref 80.0–100.0)
Monocytes Absolute: 0.3 K/uL (ref 0.1–1.0)
Monocytes Relative: 6 %
Neutro Abs: 2.3 K/uL (ref 1.7–7.7)
Neutrophils Relative %: 49 %
Platelets: 386 K/uL (ref 150–400)
RBC: 4.58 MIL/uL (ref 3.87–5.11)
RDW: 18.4 % — ABNORMAL HIGH (ref 11.5–15.5)
WBC: 4.8 K/uL (ref 4.0–10.5)
nRBC: 0 % (ref 0.0–0.2)

## 2023-10-15 LAB — T4, FREE: Free T4: 0.82 ng/dL (ref 0.61–1.12)

## 2023-10-15 LAB — TSH: TSH: 0.37 u[IU]/mL (ref 0.350–4.500)

## 2023-10-15 LAB — POCT URINE PREGNANCY: Preg Test, Ur: NEGATIVE

## 2023-10-15 MED ORDER — MECLIZINE HCL 25 MG PO TABS: 25.0000 mg | ORAL_TABLET | Freq: Three times a day (TID) | ORAL | 0 refills | Status: DC | PRN

## 2023-10-15 NOTE — Discharge Instructions (Signed)
 As we discussed, your heart rate was a little bit slow.  This could be contributing to your symptoms.  Follow-up with cardiology; call them to schedule an appointment.  Use meclizine  up to 3 times a day for dizziness.  Make sure you are drinking plenty of fluid.  I will contact you if any of your blood work is abnormal.  If anything changes and you have ongoing dizziness, weakness, passing out, shortness of breath, chest pain, heart racing need to go to the emergency room immediately.  Follow-up with your primary care as soon as possible.

## 2023-10-15 NOTE — ED Provider Notes (Signed)
 [facility]    CSN: [id number] Arrival date & time: 10/15/23  1422      History   Chief Complaint Chief Complaint  Patient presents with   Fatigue   Dizziness    HPI Marcia Hanson  is a 43 y.o. female.   Presents today with a 4-day history of dizziness.  She describes this as a room spinning sensation that is worse when she goes from a sitting to a standing position.  She also just feels fatigued and that she cannot concentrate.  She denies any fever, nausea, vomiting.  She did recently travel and returned on 10/09/2023 from [place] and [place].  Reports that there was a total of 26 hours and travel but she had a 12-hour break in between.  She has not had any cough or congestion.  She was concerned that maybe she had COVID given her recent travel and took an at home COVID that was negative.  She has a history of migraines but denies any additional neurological condition.  She denies any nausea, vomiting, diarrhea.  She denies any known insect exposure.  It is listed on her chart that she is pregnant but she reports that she had an ultrasound that showed that there was an empty gestational sac and underwent medical abortion earlier this year and is confident she is not pregnant.  She denies any calf pain, leg swelling, chest pain, shortness of breath.  Reports that she just does not feel well and is concerned because of the ongoing dizziness.    Past Medical History:  Diagnosis Date   Fatigue    Hypokalemia    Iron deficiency    Syncope     Patient Active Problem List   Diagnosis Date Noted   Postconcussion syndrome 05/21/2023   Complicated migraine 08/28/2022   Right sided weakness 08/27/2022   Recurrent pregnancy loss 07/10/2022   Iron deficiency anemia 02/03/2021   Syncope     Past Surgical History:  Procedure Laterality Date   NO PAST SURGERIES      OB History     Gravida  5   Para  0   Term  0   Preterm  0   AB  4   Living  0      SAB  4    IAB  0   Ectopic  0   Multiple  0   Live Births  0            Home Medications    Prior to Admission medications   Medication Sig Start Date End Date Taking? Authorizing Provider  meclizine  (ANTIVERT ) 25 MG tablet Take 1 tablet (25 mg total) by mouth 3 (three) times daily as needed for dizziness. 10/15/23  Yes Shantaya Bluestone, Rocky POUR, PA-C  Blood Pressure Monitoring (BLOOD PRESSURE KIT) DEVI 1 Device by Does not apply route once a week. 05/15/22   Constant, Peggy, MD  Galcanezumab -gnlm (EMGALITY ) 120 MG/ML SOAJ Inject 120 mg into the skin every 28 (twenty-eight) days. 06/13/23   Skeet Juliene SAUNDERS, DO    Family History Family History  Problem Relation Age of Onset   Diabetes Maternal Grandmother    Hypertension Maternal Grandmother    Stroke Maternal Grandfather    Diabetes Maternal Grandfather    Hypertension Maternal Grandfather    Dementia Maternal Grandfather     Social History Social History   Tobacco Use   Smoking status: Never   Smokeless tobacco: Never  Vaping Use   Vaping status: Never  Used  Substance Use Topics   Alcohol use: No   Drug use: No     Allergies   Avocado   Review of Systems Review of Systems  Constitutional:  Positive for activity change and fatigue. Negative for appetite change and fever.  HENT:  Negative for congestion.   Respiratory:  Negative for cough and shortness of breath.   Cardiovascular:  Negative for chest pain, palpitations and leg swelling.  Gastrointestinal:  Negative for abdominal pain, diarrhea, nausea and vomiting.  Musculoskeletal:  Negative for arthralgias and myalgias.  Skin:  Positive for rash (Had a rash on her anterior neck but this has resolved). Negative for wound.  Neurological:  Positive for dizziness. Negative for seizures, syncope, facial asymmetry, speech difficulty, weakness, light-headedness and headaches.  Psychiatric/Behavioral:  Positive for decreased concentration.      Physical Exam Triage Vital Signs ED  Triage Vitals  Encounter Vitals Group     BP 10/15/23 1456 (!) 163/85     Girls Systolic BP Percentile --      Girls Diastolic BP Percentile --      Boys Systolic BP Percentile --      Boys Diastolic BP Percentile --      Pulse Rate 10/15/23 1456 62     Resp 10/15/23 1456 18     Temp 10/15/23 1456 98.1 F (36.7 C)     Temp Source 10/15/23 1456 Oral     SpO2 10/15/23 1456 97 %     Weight --      Height --      Head Circumference --      Peak Flow --      Pain Score 10/15/23 1452 0     Pain Loc --      Pain Education --      Exclude from Growth Chart --    Orthostatic VS for the past 24 hrs:  BP- Lying Pulse- Lying BP- Sitting Pulse- Sitting BP- Standing at 0 minutes Pulse- Standing at 0 minutes  10/15/23 1545 146/84 55 152/88 59 (!) 159/93 62    Updated Vital Signs BP (!) 163/85 (BP Location: Left Arm)   Pulse 62   Temp 98.1 F (36.7 C) (Oral)   Resp 18   LMP 03/26/2023 (Exact Date)   SpO2 97%   Breastfeeding Unknown   Visual Acuity Right Eye Distance:   Left Eye Distance:   Bilateral Distance:    Right Eye Near:   Left Eye Near:    Bilateral Near:     Physical Exam Vitals reviewed.  Constitutional:      General: She is awake. She is not in acute distress.    Appearance: Normal appearance. She is well-developed. She is not ill-appearing.     Comments: Very pleasant female lying in a darkened room in no acute distress  HENT:     Head: Normocephalic and atraumatic. No raccoon eyes, Battle's sign or contusion.     Right Ear: Tympanic membrane, ear canal and external ear normal. No hemotympanum.     Left Ear: Tympanic membrane, ear canal and external ear normal. No hemotympanum.     Mouth/Throat:     Tongue: Tongue does not deviate from midline.     Pharynx: Uvula midline. No oropharyngeal exudate or posterior oropharyngeal erythema.  Eyes:     Extraocular Movements: Extraocular movements intact.     Pupils: Pupils are equal, round, and reactive to light.   Cardiovascular:     Rate and Rhythm: Normal rate  and regular rhythm.     Heart sounds: Normal heart sounds, S1 normal and S2 normal. No murmur heard. Pulmonary:     Effort: Pulmonary effort is normal.     Breath sounds: Normal breath sounds. No wheezing, rhonchi or rales.     Comments: Clear to auscultation bilaterally Musculoskeletal:     Cervical back: No spinous process tenderness or muscular tenderness.     Right lower leg: No edema.     Left lower leg: No edema.     Comments: Strength 5/5 bilateral upper and lower extremities  Lymphadenopathy:     Head:     Right side of head: No submental, submandibular or tonsillar adenopathy.     Left side of head: No submental, submandibular or tonsillar adenopathy.  Skin:    Findings: No rash.  Neurological:     General: No focal deficit present.     Mental Status: She is alert and oriented to person, place, and time.     Cranial Nerves: Cranial nerves 2-12 are intact.     Motor: Motor function is intact.     Coordination: Coordination is intact. Romberg sign negative. Rapid alternating movements normal.     Gait: Gait is intact.  Psychiatric:        Behavior: Behavior is cooperative.      UC Treatments / Results  Labs (all labs ordered are listed, but only abnormal results are displayed) Labs Reviewed  POCT URINALYSIS DIP (MANUAL ENTRY) - Abnormal; Notable for the following components:      Result Value   Blood, UA moderate (*)    All other components within normal limits  CBC WITH DIFFERENTIAL/PLATELET  COMPREHENSIVE METABOLIC PANEL WITH GFR  TSH  T4, FREE  POCT URINE PREGNANCY    EKG   Radiology No results found.  Procedures Procedures (including critical care time)  Medications Ordered in UC Medications - No data to display  Initial Impression / Assessment and Plan / UC Course  I have reviewed the triage vital signs and the nursing notes.  Pertinent labs & imaging results that were available during my care  of the patient were reviewed by me and considered in my medical decision making (see chart for details).     Patient is well-appearing, afebrile, nontoxic, nontachycardic.  Initially her chart indicated that she was pregnant but she confirmed that this was not the case and a urine pregnancy test was negative in clinic.  Her symptoms are most consistent with vertigo.  Vital signs and physical exam are reassuring.  EKG was obtained that showed sinus bradycardia with T wave inversion in V2 but without ischemic changes; compared to 08/27/2022 tracing sinus bradycardia replaces normal sinus rhythm and T wave inversion is new.  We discussed that it is possible that bradycardia is contributing to her symptoms and since she is having some dizziness open reasonable to go to the ER but she declined to do this as she did not believe her symptoms were severe enough to warrant ER evaluation.  She did have recent travel but denies any symptoms of DVT.  Wells score 0.  She requested that we complete workup in urgent care but did agree that if she has any worsening or changing symptoms she would go to the ER.  UA showed blood but she reported that she is on her menstrual cycle; it was otherwise normal with no evidence of dehydration.  Orthostatic vital signs were essentially normal.  CBC, CMP, thyroid studies were obtained and are  pending.  She was given meclizine  for symptomatic management.  She was encouraged to eat small frequent meals and push fluids.  Recommend she follow-up outpatient with cardiology and was given the contact information for local provider with instruction to call to schedule an appointment.  We discussed that if her symptoms or not improving quickly she would need to follow-up with her primary care.  If she has persistent dizziness or develops any worsening symptoms including shortness of breath, chest pain, nausea/vomiting interfering with oral intake, weakness she would need to go to the emergency room  immediately to which she expressed understanding.  Final Clinical Impressions(s) / UC Diagnoses   Final diagnoses:  Vertigo  Malaise and fatigue  Bradycardia     Discharge Instructions      As we discussed, your heart rate was a little bit slow.  This could be contributing to your symptoms.  Follow-up with cardiology; call them to schedule an appointment.  Use meclizine  up to 3 times a day for dizziness.  Make sure you are drinking plenty of fluid.  I will contact you if any of your blood work is abnormal.  If anything changes and you have ongoing dizziness, weakness, passing out, shortness of breath, chest pain, heart racing need to go to the emergency room immediately.  Follow-up with your primary care as soon as possible.     ED Prescriptions     Medication Sig Dispense Auth. Provider   meclizine  (ANTIVERT ) 25 MG tablet Take 1 tablet (25 mg total) by mouth 3 (three) times daily as needed for dizziness. 30 tablet Naithen Rivenburg K, PA-C      PDMP not reviewed this encounter.   Sherrell Rocky POUR, PA-C 10/15/23 1643

## 2023-10-15 NOTE — ED Notes (Signed)
 EKG equipment is in use in in-take

## 2023-10-15 NOTE — ED Triage Notes (Signed)
 Onset 5 days ago.  Patient had [place] .  Has dizziness, fatigue.  Patient is taking benadryl  for whelps that she notices in the evening.  Patient has an itchy, scratchy throat.  Noticed the room spinning when she leans her head back.  Patient reports a negative home test.

## 2023-10-15 NOTE — ED Triage Notes (Signed)
 [place] to [place]  [place] 7[place], returned to her home on 7/2

## 2023-12-17 NOTE — Progress Notes (Signed)
 Subjective   Patient ID:  Marcia Hanson  is a 43 y.o. (DOB November 23, 1980) female.   History of Present Illness The patient is here for a consultation regarding iron deficiency anemia.  She has been dealing with anemia throughout her life, but recently she has been more proactive in managing it. A few years ago, she received an infusion at the cancer center in [place], which she found beneficial. Despite taking supplements and other measures, she continues to struggle with the condition. She experiences heavy menstrual periods every three months, but not consistently. Her diet is primarily vegetarian, with occasional seafood and red meat consumption once a month. She avoids red meat due to increased fatigue after consumption. She maintains a balanced diet, including all vegetables, beans, and legumes. She reports no presence of blood in her stool or urine. She has a long-standing craving for ice, which has persisted for years. She also experiences muscle aches and hair loss. In 05/2023, she received a blood transfusion following a miscarriage, which improved her condition. She felt significantly better after her last IV iron treatment. She describes herself as a high-functioning individual but struggles with constant fatigue. She consumes juice daily and takes an iron supplement with vitamin C. She has never undergone stomach surgery and reports no heartburn or severe stomachaches. She had Nexplanon implanted previously, which caused frequent breakthrough bleeding, leading to its removal last year. She had anemia during her progesterone treatment, which she attributes to constant bleeding.    Oncology History   No problem history exists.    Cancer Staging <redacted file path>  No matching staging information was found for the patient.  No past medical history on file. Social History[1] Family History  Problem Relation Age of Onset  . Hypertension Father   . No Known Problems Mother   . No  Known Problems Brother   . No Known Problems Sister   . Colon cancer Neg Hx   . Ovarian cancer Neg Hx   . Breast cancer Neg Hx       Medications Taking[2]  Review of Systems:  As per history of present illness. All other systems are reviewed and are negative.    Objective   Physical Exam:  Vital Signs: BP 140/85 (BP Location: Right Upper Arm, Patient Position: Sitting)   Pulse 57   Temp 98.4 F (36.9 C) (Temporal)   Resp 18   Ht 5' 5 (1.651 m)   Wt 134 lb (60.8 kg)   SpO2 100%   BMI 22.30 kg/m Pain Score: 0-No pain Pain Intervention: No pain  Constitutional:The patient is a 43 y.o. African American female who is well-developed, well-nourished, and appears to be his/her stated age.  The patient is alert and oriented, and does not appear to be in acute distress. ECOG status is 1 - Symptomatic but completely ambulatory.     Accessory Clinical Data:  All pertinent labs/imaging reviewed and discussed with patient.   No visits with results within 1 Day(s) from this visit.  Latest known visit with results is:  Annual Physical on 11/05/2023  Component Date Value Ref Range Status  . Vit D, 25-Hydroxy 11/05/2023 23.0 (L)  30.0 - 100.0 ng/mL Final  . Ferritin 11/05/2023 9 (L)  15 - 150 ng/mL Final  . TIBC 11/05/2023 352  250 - 450 ug/dL Final  . UIBC 92/71/7974 329  131 - 425 ug/dL Final  . Iron 92/71/7974 23 (L)  27 - 159 ug/dL Final  . Iron Saturation 11/05/2023 7 (L)  15 - 55 % Final  . Cholesterol, Total 11/05/2023 173  100 - 199 mg/dL Final  . Triglycerides 11/05/2023 58  0 - 149 mg/dL Final  . HDL 92/71/7974 62  >39 mg/dL Final  . VLDL Cholesterol Cal 11/05/2023 11  5 - 40 mg/dL Final  . LDL 92/71/7974 100 (H)  0 - 99 mg/dL Final  . Glucose 92/71/7974 88  70 - 99 mg/dL Final  . BUN 92/71/7974 11  6 - 24 mg/dL Final  . Creatinine 92/71/7974 0.89  0.57 - 1.00 mg/dL Final  . eGFR 92/71/7974 84  >59 mL/min/1.73 Final  . BUN/Creatinine Ratio 11/05/2023 12  9 - 23 Final   . Sodium 11/05/2023 136  134 - 144 mmol/L Final  . Potassium 11/05/2023 4.6  3.5 - 5.2 mmol/L Final  . Chloride 11/05/2023 104  96 - 106 mmol/L Final  . CO2 11/05/2023 20  20 - 29 mmol/L Final  . CALCIUM 11/05/2023 9.1  8.7 - 10.2 mg/dL Final  . Total Protein 11/05/2023 7.3  6.0 - 8.5 g/dL Final  . Albumin, Serum 11/05/2023 4.2  3.9 - 4.9 g/dL Final  . Globulin, Total 11/05/2023 3.1  1.5 - 4.5 g/dL Final  . Total Bilirubin 11/05/2023 0.4  0.0 - 1.2 mg/dL Final  . Alkaline Phosphatase 11/05/2023 59  44 - 121 IU/L Final  . AST 11/05/2023 10  0 - 40 IU/L Final  . ALT (SGPT) 11/05/2023 7  0 - 32 IU/L Final  . TSH 11/05/2023 0.463  0.450 - 4.50 uIU/mL Final  . WBC 11/05/2023 6.2  3.4 - 10.8 x10E3/uL Final  . RBC 11/05/2023 4.39  3.77 - 5.28 x10E6/uL Final  . Hemoglobin 11/05/2023 10.1 (L)  11.1 - 15.9 g/dL Final  . Hematocrit 92/71/7974 35.4  34.0 - 46.6 % Final  . MCV 11/05/2023 81  79 - 97 fL Final  . MCH 11/05/2023 23.0 (L)  26.6 - 33.0 pg Final  . MCHC 11/05/2023 28.5 (L)  31.5 - 35.7 g/dL Final  . RDW 92/71/7974 19.7 (H)  11.7 - 15.4 % Final  . Platelet Count 11/05/2023 338  150 - 450 x10E3/uL Final  . Neutrophils 11/05/2023 57  Not Estab. % Final  . Lymphs Relative 11/05/2023 31  Not Estab. % Final  . Monocytes 11/05/2023 6  Not Estab. % Final  . Eos Relative 11/05/2023 5  Not Estab. % Final  . Basos Relative  11/05/2023 1  Not Estab. % Final  . Neutrophils Absolute 11/05/2023 3.5  1.4 - 7.0 x10E3/uL Final  . Lymphocytes Absolute 11/05/2023 1.9  0.7 - 3.1 x10E3/uL Final  . Monocytes Absolute 11/05/2023 0.4  0.1 - 0.9 x10E3/uL Final  . Eosinophils Absolute 11/05/2023 0.3  0.0 - 0.4 x10E3/uL Final  . Basophils Absolute 11/05/2023 0.1  0.0 - 0.2 x10E3/uL Final  . Immature Granulocytes 11/05/2023 0  Not Estab. % Final  . Immature Grans (Abs) 11/05/2023 0.0  0.0 - 0.1 x10E3/uL Final        Impression   1. Iron deficiency anemia, unspecified iron deficiency anemia type       Plan   Assessment & Plan 1. Iron deficiency anemia. Iron levels were low in 2022 and have remained low. The primary cause of iron loss appears to be menstrual cycles, which occur once every three months and are irregular. Heavy periods may contribute to iron deficiency. The diet, which includes red meat once a month, may not be sufficient to meet iron needs. There is also a  possibility of malabsorption of iron. An IV iron infusion will be scheduled today. Consuming red meat at least once a week is advised to help increase iron levels. A follow-up appointment will be scheduled in three months to monitor progress and ensure iron levels have improved.  Treatment Plan: IV iron infusion will be scheduled. Expected to start feeling better within 1 to 2 weeks after infusion as the body makes blood. Goals include improving symptoms such as muscle cramps, ice cravings, and hair loss. Follow-up in three months to ensure iron levels are maintained. Potential side effects of IV iron infusion include allergic reactions, hypotension, and gastrointestinal disturbances. Supportive care measures include dietary adjustments to increase iron intake.         At the end of our visit, the patient verbalized good understanding of the plan of care and all questions were answered to their satisfaction.       [1] Social History Socioeconomic History  . Marital status: Single  Tobacco Use  . Smoking status: Never    Passive exposure: Never  . Smokeless tobacco: Never  Vaping Use  . Vaping status: Never Used  Substance and Sexual Activity  . Alcohol use: Yes    Alcohol/week: 0.0 - 1.0 standard drinks of alcohol  . Drug use: No  . Sexual activity: Yes    Partners: Male  [2] Outpatient Medications Marked as Taking for the 12/17/23 encounter (Office Visit) with Johnie Door, MD  Medication Sig Dispense Refill  . ALPRAZolam  (XANAX ) 0.25 mg tablet Take one tablet (0.25 mg dose) by mouth 2 (two) times a day  as needed for Anxiety. Max Daily Amount: 0.5 mg 30 tablet 0  . Boric Acid GRAN Please make 600mg  suppositories.  Please insert vaginal suppository twice weekly 113 g 1  . ferrous sulfate  325 (65 FE) MG tablet Take one tablet (325 mg dose) by mouth daily. Take with orange juice 30 tablet 3  . naproxen (NAPROSYN) 500 mg tablet Take one tablet (500 mg dose) by mouth 2 (two) times a day as needed (pain). Take with food 60 tablet 0  . ondansetron  (ZOFRAN -ODT) 4 mg disintegrating tablet Take one tablet (4 mg dose) by mouth every 8 (eight) hours as needed for Nausea. 20 tablet 0

## 2023-12-23 NOTE — Telephone Encounter (Signed)
Contacted to schedule appointment

## 2023-12-28 ENCOUNTER — Emergency Department (HOSPITAL_COMMUNITY)
Admission: EM | Admit: 2023-12-28 | Discharge: 2023-12-29 | Disposition: A | Discharge status: Home / Self Care | Attending: Emergency Medicine | Admitting: Emergency Medicine

## 2023-12-28 ENCOUNTER — Emergency Department (HOSPITAL_COMMUNITY)

## 2023-12-28 ENCOUNTER — Other Ambulatory Visit: Payer: Self-pay

## 2023-12-28 DIAGNOSIS — R55 Syncope and collapse: Secondary | ICD-10-CM | POA: Insufficient documentation

## 2023-12-28 DIAGNOSIS — R519 Headache, unspecified: Secondary | ICD-10-CM | POA: Insufficient documentation

## 2023-12-28 DIAGNOSIS — R079 Chest pain, unspecified: Secondary | ICD-10-CM | POA: Diagnosis present

## 2023-12-28 DIAGNOSIS — R200 Anesthesia of skin: Secondary | ICD-10-CM | POA: Diagnosis not present

## 2023-12-28 DIAGNOSIS — R0602 Shortness of breath: Secondary | ICD-10-CM | POA: Insufficient documentation

## 2023-12-28 DIAGNOSIS — R072 Precordial pain: Secondary | ICD-10-CM | POA: Insufficient documentation

## 2023-12-28 DIAGNOSIS — R112 Nausea with vomiting, unspecified: Secondary | ICD-10-CM | POA: Insufficient documentation

## 2023-12-28 DIAGNOSIS — R5383 Other fatigue: Secondary | ICD-10-CM | POA: Insufficient documentation

## 2023-12-28 LAB — BASIC METABOLIC PANEL WITH GFR
Anion gap: 13 (ref 5–15)
BUN: 8 mg/dL (ref 6–20)
CO2: 21 mmol/L — ABNORMAL LOW (ref 22–32)
Calcium: 9.1 mg/dL (ref 8.9–10.3)
Chloride: 103 mmol/L (ref 98–111)
Creatinine, Ser: 0.64 mg/dL (ref 0.44–1.00)
GFR, Estimated: >60 mL/min (ref >60)
Glucose, Bld: 90 mg/dL (ref 70–99)
Potassium: 4 mmol/L (ref 3.5–5.1)
Sodium: 137 mmol/L (ref 135–145)

## 2023-12-28 LAB — CBC
HCT: 33.5 % — ABNORMAL LOW (ref 36.0–46.0)
Hemoglobin: 10.1 g/dL — ABNORMAL LOW (ref 12.0–15.0)
MCH: 23.4 pg — ABNORMAL LOW (ref 26.0–34.0)
MCHC: 30.1 g/dL (ref 30.0–36.0)
MCV: 77.7 fL — ABNORMAL LOW (ref 80.0–100.0)
Platelets: 315 K/uL (ref 150–400)
RBC: 4.31 MIL/uL (ref 3.87–5.11)
RDW: 16.8 % — ABNORMAL HIGH (ref 11.5–15.5)
WBC: 5.4 K/uL (ref 4.0–10.5)
nRBC: 0 % (ref 0.0–0.2)

## 2023-12-28 LAB — TROPONIN T, HIGH SENSITIVITY
Troponin T High Sensitivity: <15 ng/L (ref 0–19)
Troponin T High Sensitivity: <15 ng/L (ref 0–19)

## 2023-12-28 LAB — RESP PANEL BY RT-PCR (RSV, FLU A&B, COVID)  RVPGX2
Influenza A by PCR: NEGATIVE
Influenza B by PCR: NEGATIVE
Resp Syncytial Virus by PCR: NEGATIVE
SARS Coronavirus 2 by RT PCR: NEGATIVE

## 2023-12-28 LAB — HCG, SERUM, QUALITATIVE: Preg, Serum: NEGATIVE

## 2023-12-28 LAB — D-DIMER, QUANTITATIVE: D-Dimer, Quant: 0.59 ug{FEU}/mL — ABNORMAL HIGH (ref 0.00–0.50)

## 2023-12-28 MED ORDER — PROCHLORPERAZINE EDISYLATE 10 MG/2ML IJ SOLN
10.0000 mg | Freq: Once | INTRAMUSCULAR | Status: AC
  Administered 2023-12-28: 10 mg via INTRAVENOUS
  Filled 2023-12-28: qty 2

## 2023-12-28 MED ORDER — ONDANSETRON 4 MG PO TBDP
4.0000 mg | ORAL_TABLET | Freq: Once | ORAL | Status: AC
  Administered 2023-12-28: 4 mg via ORAL
  Filled 2023-12-28: qty 1

## 2023-12-28 MED ORDER — KETOROLAC TROMETHAMINE 15 MG/ML IJ SOLN
15.0000 mg | Freq: Once | INTRAMUSCULAR | Status: AC
  Administered 2023-12-28: 15 mg via INTRAVENOUS
  Filled 2023-12-28: qty 1

## 2023-12-28 MED ORDER — DIPHENHYDRAMINE HCL 50 MG/ML IJ SOLN
12.5000 mg | Freq: Once | INTRAMUSCULAR | Status: AC
  Administered 2023-12-28: 12.5 mg via INTRAVENOUS
  Filled 2023-12-28: qty 1

## 2023-12-28 MED ORDER — SODIUM CHLORIDE 0.9 % IV BOLUS
1000.0000 mL | Freq: Once | INTRAVENOUS | Status: AC
  Administered 2023-12-28: 1000 mL via INTRAVENOUS

## 2023-12-28 MED ORDER — IOHEXOL 350 MG/ML SOLN
75.0000 mL | Freq: Once | INTRAVENOUS | Status: AC | PRN
Start: 2023-12-28 — End: 2023-12-28
  Administered 2023-12-28: 75 mL via INTRAVENOUS

## 2023-12-28 MED ORDER — DEXAMETHASONE SODIUM PHOSPHATE 10 MG/ML IJ SOLN
10.0000 mg | Freq: Once | INTRAMUSCULAR | Status: AC
  Administered 2023-12-28: 10 mg via INTRAVENOUS
  Filled 2023-12-28: qty 1

## 2023-12-28 NOTE — Discharge Instructions (Addendum)
 As we discussed, your workup in the ER today was reassuring for acute findings.  Laboratory evaluation and CT imaging did not reveal any emergent cause of your symptoms.  Your hemoglobin was mildly decreased, I recommend that you work on setting up your iron infusion.  Please call your PCP to schedule close follow-up appointment as well.  Return if development of any new or worsening symptoms.

## 2023-12-28 NOTE — ED Notes (Signed)
 Pt ambulatory to ED Rm 6. Stated even on that short walk she became very winded and ShoB.

## 2023-12-28 NOTE — ED Provider Notes (Addendum)
 [facility] AT [facility] Provider Note   CSN: [id number] Arrival date & time: 12/28/23  8380     Patient presents with: Chest Pain, Numbness, and Loss of Consciousness   Marcia Hanson  is a 43 y.o. female.   Patient with history of iron deficiency and migraines presents today with complaints of chest pain, shortness of breath, headache, and syncope. She reports that she has had a headache for the past 4 days. Reports that pain is behind her right eye and is consistent with her chronic migraines, however reports she has never had a headache last this long. Her pain came on gradually, was not sudden in onset. She gets monthly injections of Emgality  for this. States that she woke up this morning feeling fatigued and generally unwell. Reports she had 1 episode of nausea and vomiting. States she was out of breath walking to the bathroom which is very unusual for her. When she got to the bathroom she subsequently lost consciousness, woke up on the bathroom floor. She does not think she injured herself. Does report that she urinated on herself. When she woke up she noted that she felt generally unwell. Notes that she had chest pain as well since the syncopal episode. Reports she has passed out previously but states she has never had the associated symptoms. Also reports that she has anemia and is supposed to have an iron infusion, however reports this feels different then the anemia symptoms she has had before. Also reports she just returned via airplane from [place]  yesterday. Reports she has also been sick with URI symptoms for the past week. Denies any leg pain or leg swelling. No vision changes. Denies any chance of pregnancy.   The history is provided by the patient. No language interpreter was used.  Chest Pain Associated symptoms: headache, nausea, shortness of breath, syncope and vomiting   Loss of Consciousness Associated symptoms: chest pain, headaches,  nausea, shortness of breath and vomiting        Prior to Admission medications   Medication Sig Start Date End Date Taking? Authorizing Provider  Blood Pressure Monitoring (BLOOD PRESSURE KIT) DEVI 1 Device by Does not apply route once a week. 05/15/22   Constant, Peggy, MD  Galcanezumab -gnlm (EMGALITY ) 120 MG/ML SOAJ Inject 120 mg into the skin every 28 (twenty-eight) days. 06/13/23   Skeet Juliene SAUNDERS, DO  meclizine  (ANTIVERT ) 25 MG tablet Take 1 tablet (25 mg total) by mouth 3 (three) times daily as needed for dizziness. 10/15/23   Raspet, Erin K, PA-C    Allergies: Avocado    Review of Systems  Respiratory:  Positive for shortness of breath.   Cardiovascular:  Positive for chest pain and syncope.  Gastrointestinal:  Positive for nausea and vomiting.  Neurological:  Positive for syncope and headaches.  All other systems reviewed and are negative.   Updated Vital Signs BP (!) 158/88 (BP Location: Left Arm)   Pulse 69   Temp 97.6 F (36.4 C) (Oral)   Resp 13   LMP 11/30/2023 (Exact Date)   SpO2 100%   Physical Exam Vitals and nursing note reviewed.  Constitutional:      General: She is not in acute distress.    Appearance: Normal appearance. She is normal weight. She is not ill-appearing, toxic-appearing or diaphoretic.  HENT:     Head: Normocephalic and atraumatic.  Cardiovascular:     Rate and Rhythm: Normal rate and regular rhythm.     Pulses:  Dorsalis pedis pulses are 2+ on the right side and 2+ on the left side.       Posterior tibial pulses are 2+ on the right side and 2+ on the left side.     Heart sounds: Normal heart sounds.  Pulmonary:     Effort: Pulmonary effort is normal. No respiratory distress.     Breath sounds: Normal breath sounds.  Chest:     Chest wall: No tenderness.  Abdominal:     Palpations: Abdomen is soft.     Tenderness: There is no abdominal tenderness.  Musculoskeletal:        General: Normal range of motion.     Cervical back: Normal  range of motion.     Right lower leg: No tenderness. No edema.     Left lower leg: No tenderness. No edema.  Skin:    General: Skin is warm and dry.  Neurological:     General: No focal deficit present.     Mental Status: She is alert and oriented to person, place, and time.  Psychiatric:        Mood and Affect: Mood normal.        Behavior: Behavior normal.     (all labs ordered are listed, but only abnormal results are displayed) Labs Reviewed  BASIC METABOLIC PANEL WITH GFR - Abnormal; Notable for the following components:      Result Value   CO2 21 (*)    All other components within normal limits  CBC - Abnormal; Notable for the following components:   Hemoglobin 10.1 (*)    HCT 33.5 (*)    MCV 77.7 (*)    MCH 23.4 (*)    RDW 16.8 (*)    All other components within normal limits  D-DIMER, QUANTITATIVE - Abnormal; Notable for the following components:   D-Dimer, Quant 0.59 (*)    All other components within normal limits  RESP PANEL BY RT-PCR (RSV, FLU A&B, COVID)  RVPGX2  HCG, SERUM, QUALITATIVE  TROPONIN T, HIGH SENSITIVITY  TROPONIN T, HIGH SENSITIVITY    EKG: None  Radiology: DG Chest 2 View Result Date: 12/28/2023 CLINICAL DATA:  Chest tightness EXAM: CHEST - 2 VIEW COMPARISON:  06/16/2022 FINDINGS: The heart size and mediastinal contours are within normal limits. Both lungs are clear. The visualized skeletal structures are unremarkable. No pneumothorax. IMPRESSION: No active cardiopulmonary disease. Electronically Signed   By: Franky Crease M.D.   On: 12/28/2023 17:07     Procedures   Medications Ordered in the ED - No data to display                                  Medical Decision Making Amount and/or Complexity of Data Reviewed Labs: ordered. Radiology: ordered.  Risk Prescription drug management.   This patient is a 44 y.o. female who presents to the ED for concern of headache syncope, chest pain, shortness of breath, this involves an  extensive number of treatment options, and is a complaint that carries with it a high risk of complications and morbidity. The emergent differential diagnosis prior to evaluation includes, but is not limited to,  ACS/PE, electrolyte derangement, anemia, seizure, vasovagal syncope, viral illness . This is not an exhaustive differential.   Past Medical History / Co-morbidities / Social History:  has a past medical history of Fatigue, Hypokalemia, Iron deficiency, and Syncope.  Additional history: Chart reviewed. Pertinent results  include: is supposed to have an iron infusion for her anemia  Physical Exam: Physical exam performed. The pertinent findings include: well appearing, alert and oriented and neurologically intact without focal deficits. No acute physical exam abnormalities.  Lab Tests: I ordered, and personally interpreted labs.  The pertinent results include:  hgb 10.1 (was 10.4 2 months ago). D dimer 0.59   Imaging Studies: I ordered imaging studies including CXR, CT head, CTA PE. I independently visualized and interpreted imaging which showed   CXR: NAD  CT head: NAD  CTA PE: NAD  I agree with the radiologist interpretation.   Cardiac Monitoring:  The patient was maintained on a cardiac monitor.  My attending physician viewed and interpreted the cardiac monitored which showed an underlying rhythm of: sinus rhythm, no STEMI. I agree with this interpretation.   Medications: I ordered medication including zofran , compazine , benadryl , decadron , toradol   for headache. Reevaluation of the patient after these medicines showed that the patient improved. I have reviewed the patients home medicines and have made adjustments as needed.   Disposition:   Patients work-up is benign, she is satting normally on room air. Her only current symptom is headache and therefore headache cocktail provided. Reassessment is pending at shift change. Will need reassessment after these have been given.  Her headache is consistent with her migraines that she has been seen and evaluated for previously. No sudden onset, no neurologic deficits, no symptoms to suggest SAH, no indication for angio imaging or LP. No meningismis. Suspect d/c home  Care handoff to Pleasant Eng, PA-C at shift change.  Please see their note for continued evaluation and disposition.  Final diagnoses:  Bad headache  Syncope, unspecified syncope type  Precordial chest pain  Fatigue, unspecified type    ED Discharge Orders     None          Nora Lauraine DELENA DEVONNA 12/29/23 2031    Nora Lauraine DELENA, PA-C 12/29/23 2032    Elnor Jayson DELENA, DO 01/09/24 1524

## 2023-12-28 NOTE — ED Provider Notes (Signed)
  Physical Exam  BP (!) 140/101   Pulse (!) 57   Temp 98.1 F (36.7 C) (Oral)   Resp 17   LMP 11/30/2023 (Exact Date)   SpO2 100%   Physical Exam  Procedures  Procedures  ED Course / MDM    Medical Decision Making Amount and/or Complexity of Data Reviewed Labs: ordered. Radiology: ordered.  Risk Prescription drug management.    Care of this patient assumed from preceding ED provider Sarah Smoot, PA-C at time of shift change.  Please see her associated note for further insight in the patient's ED course.  In brief patient is a 43 year old female presents with concern for shortness of breath, fatigue headache.  Reassuring workup for cardiopulmonary concerns including negative CT PE study.  CT of the head is negative.  At time of shift change patient is pending migraine cocktail and reevaluation. Plan for d/c home.    Patient reevaluated after migraine cocktail with significant improvement in her headache.  She feels she is ready to be discharged home.  Clinical concern for emergent underlying condition that would warrant further ED workup and patient management is exceedingly low. Monna  voiced understanding of her medical evaluation and treatment plan. Each of their questions answered to their expressed satisfaction.  Return precautions were given.  Patient is well-appearing, stable, and was discharged in good condition.  This chart was dictated using voice recognition software, Dragon. Despite the best efforts of this provider to proofread and correct errors, errors may still occur which can change documentation meaning.      Bobette Pleasant SAUNDERS, PA-C 12/29/23 0011    Elnor Jayson LABOR, DO 01/09/24 1524

## 2023-12-28 NOTE — ED Triage Notes (Addendum)
 Pt came in for chest tightness w/ movement that started today and syncope earlier. Pt stated she woke up on the floor with no recollection of what happened and gets out of breath when moving. Pt also stated she's had a headache for a few days and there tingling in her fingers/toes that started today.

## 2024-04-17 NOTE — Progress Notes (Signed)
 Patient ID:  Marcia Hanson  is a 44 y.o. (DOB 1980-12-27) female.  Assessment and Plan   1. Loss of menstrual periods   2. Positive pregnancy test (*)   3. Rash   4. Iron deficiency anemia, unspecified iron deficiency anemia type     Assessment & Plan 1-2. Positive POCT hCG test confirms pregnancy. History of irregular cycles and miscarriages. Diagnostic plan: Blood test for beta-hCG levels today. Referral to OB/GYN at [facility] in [place] initiated. Treatment plan: Continue prenatal vitamins.  3. Hx of pityriasis rosea (presented the same as current sxs) or dermatitis. Non-itchy, dry, and scaly. Treatment plan: Prescribed topical Triamcinolone  0.025%, instructed to apply with Aquaphor.  4. Iron deficiency anemia History of iron deficiency anemia, last iron infusion on 01/14/2024. Felt better post-infusion, recent extreme fatigue which led her to take pregnancy test.  Follow-up:    Results Labs  - POCT hCG: 04/17/2024, Positive  Previous office visits reviewed.  Previous labs reviewed.  Patient understands and agrees with plan. Computer technology was used to create visit note. Consent from the patient/caregiver was obtained prior to use.  Follow up in about 7 months (around 11/11/2024) for Annual physical, Labs.    Patient's Medications       * Accurate as of April 17, 2024  5:24 PM. Reflects encounter med changes as of last refresh          New Prescriptions      Instructions  triamcinolone  0.025% ointment Commonly known as: KENalog  Started by: Hoy Miu, PA-C  Topical, 2 times a day       Continued Medications      Instructions  ALPRAZolam  0.25 mg tablet Commonly known as: XANAX   0.25 mg, Oral, 2 times a day as needed   Boric Acid Barbarann  Please make 600mg  suppositories.  Please insert vaginal suppository twice weekly   calcium carbonate 600 MG Tabs Commonly known as: OS-CAL  600 mg, Two times a day with meals   EMGALITY   120 MG/ML Soaj Generic drug: Galcanezumab -gnlm  120 mg, Every 28 days   ergocalciferol 50,000 units Caps capsule Commonly known as: Vitamin D2  50,000 Units, Weekly at 0900   ferrous sulfate  325 (65 FE) MG tablet  325 mg, Oral, Daily, Take with orange juice   vitamin B-12 100 mcg tablet Commonly known as: CYANOCOBALAMIN  100 mcg, Daily       Discontinued Medications    naproxen 500 mg tablet Commonly known as: NAPROSYN Stopped by: Hoy Miu, PA-C   ondansetron  4 mg disintegrating tablet Commonly known as: ZOFRAN -ODT Stopped by: Hoy Miu, PA-C        Orders Placed This Encounter  Procedures   hCG, Quantitative   Ambulatory referral to Obstetrics / Gynecology   POCT Pregnancy, Urine    Risks, benefits, and alternatives of the medications and treatment plan prescribed today were discussed, and patient expressed understanding. Plan follow-up as discussed or as needed if any worsening symptoms or change in condition.    A yearly preventative health exam was recommended and current age based recommendations were discussed.   Subjective   Patient ID:  Marcia Hanson  is a 44 y.o. (DOB 09/19/1980) female    Patient presents with   Fatigue    Possible pregnancy     HPI:   History of Present Illness 44 year old female with amenorrhea and iron deficiency anemia.  Reports irregular menstrual cycles, last periods in 01/2024 and 02/2024. Significant fatigue attributed to low iron levels. Positive home and  clinic pregnancy tests. Desires to maintain pregnancy, started prenatal vitamins. No current OB/GYN. Declines STD testing, stable relationship. History of four miscarriages, genetic testing showed no abnormalities.  New unilateral rash on abdomen, non-itchy. Hx of bilateral rashes in winter, resolving in six weeks. Applying CeraVe.  Referred to hematology in 10/2023, last iron infusion on 01/14/2024. Felt better post-infusion, recent fatigue  resurgence since absence of menses.  Sexual Practices: Stable relationship with the same partner  GYNECOLOGICAL HISTORY: Last Menstrual Period: 02/2024 Frequency and Flow: Irregular    Past Medical History, Past Surgery History, Allergies, Social History, and Family History were reviewed and updated.    Review of Systems  Constitutional: Negative.   HENT: Negative.    Respiratory: Negative.    Cardiovascular: Negative.   Gastrointestinal: Negative.   Genitourinary:  Positive for menstrual problem (missed menses x 1 month).  Musculoskeletal: Negative.   Skin:  Positive for rash (on lower left abdomen).  Neurological: Negative.   All other systems reviewed and are negative.    Objective   Vitals:   04/17/24 1524  BP: 124/80  Patient Position: Sitting  Pulse: 77  Temp: 96.9 F (36.1 C)  TempSrc: Temporal  Resp: 18  Height: 5' 5 (1.651 m)  Weight: 139 lb 6.4 oz (63.2 kg)  SpO2: 98%  BMI (Calculated): 23.2  PainSc: 0-No pain      Physical Exam Vitals and nursing note reviewed.  Constitutional:      General: She is not in acute distress.    Appearance: Normal appearance.  HENT:     Head: Normocephalic and atraumatic.     Nose: Nose normal.  Eyes:     Conjunctiva/sclera: Conjunctivae normal.  Musculoskeletal:     Cervical back: Normal range of motion.  Pulmonary:     Effort: Pulmonary effort is normal.  Skin:    General: Skin is warm and dry.     Findings: Rash (flat, dry rash in left lower area of abdomen) present.  Neurological:     General: No focal deficit present.     Mental Status: She is alert and oriented to person, place, and time.       Voice recognition software was used and creation of this note. Despite my best effort at editing the text, some misspelling/errors may have occurred. *Some images could not be shown.

## 2024-04-18 ENCOUNTER — Inpatient Hospital Stay (HOSPITAL_COMMUNITY)
Admission: AD | Admit: 2024-04-18 | Discharge: 2024-04-18 | Disposition: A | Discharge status: Home / Self Care | Attending: Emergency Medicine | Admitting: Emergency Medicine

## 2024-04-18 ENCOUNTER — Encounter (HOSPITAL_COMMUNITY): Payer: Self-pay | Admitting: Obstetrics & Gynecology

## 2024-04-18 ENCOUNTER — Inpatient Hospital Stay (HOSPITAL_COMMUNITY)

## 2024-04-18 DIAGNOSIS — Z3A01 Less than 8 weeks gestation of pregnancy: Secondary | ICD-10-CM

## 2024-04-18 DIAGNOSIS — O26891 Other specified pregnancy related conditions, first trimester: Secondary | ICD-10-CM

## 2024-04-18 DIAGNOSIS — O99351 Diseases of the nervous system complicating pregnancy, first trimester: Secondary | ICD-10-CM | POA: Diagnosis not present

## 2024-04-18 DIAGNOSIS — R109 Unspecified abdominal pain: Secondary | ICD-10-CM | POA: Diagnosis not present

## 2024-04-18 DIAGNOSIS — R55 Syncope and collapse: Secondary | ICD-10-CM | POA: Insufficient documentation

## 2024-04-18 LAB — COMPREHENSIVE METABOLIC PANEL WITH GFR
ALT: 13 U/L (ref 0–44)
AST: 20 U/L (ref 15–41)
Albumin: 4.1 g/dL (ref 3.5–5.0)
Alkaline Phosphatase: 68 U/L (ref 38–126)
Anion gap: 9 (ref 5–15)
BUN: 8 mg/dL (ref 6–20)
CO2: 23 mmol/L (ref 22–32)
Calcium: 9.2 mg/dL (ref 8.9–10.3)
Chloride: 102 mmol/L (ref 98–111)
Creatinine, Ser: 0.65 mg/dL (ref 0.44–1.00)
GFR, Estimated: >60 mL/min
Glucose, Bld: 102 mg/dL — ABNORMAL HIGH (ref 70–99)
Potassium: 3.9 mmol/L (ref 3.5–5.1)
Sodium: 135 mmol/L (ref 135–145)
Total Bilirubin: 0.3 mg/dL (ref 0.0–1.2)
Total Protein: 7.5 g/dL (ref 6.5–8.1)

## 2024-04-18 LAB — CBC
HCT: 41.8 % (ref 36.0–46.0)
Hemoglobin: 13.9 g/dL (ref 12.0–15.0)
MCH: 29.9 pg (ref 26.0–34.0)
MCHC: 33.3 g/dL (ref 30.0–36.0)
MCV: 89.9 fL (ref 80.0–100.0)
Platelets: 302 K/uL (ref 150–400)
RBC: 4.65 MIL/uL (ref 3.87–5.11)
RDW: 14.3 % (ref 11.5–15.5)
WBC: 6.2 K/uL (ref 4.0–10.5)
nRBC: 0 % (ref 0.0–0.2)

## 2024-04-18 LAB — WET PREP, GENITAL
Clue Cells Wet Prep HPF POC: NONE SEEN
Sperm: NONE SEEN
Trich, Wet Prep: NONE SEEN
WBC, Wet Prep HPF POC: <10
Yeast Wet Prep HPF POC: NONE SEEN

## 2024-04-18 LAB — URINALYSIS, ROUTINE W REFLEX MICROSCOPIC
Bilirubin Urine: NEGATIVE
Glucose, UA: NEGATIVE mg/dL
Ketones, ur: NEGATIVE mg/dL
Leukocytes,Ua: NEGATIVE
Nitrite: NEGATIVE
Protein, ur: 100 mg/dL — AB
Specific Gravity, Urine: 1.025 (ref 1.005–1.030)
pH: 6 (ref 5.0–8.0)

## 2024-04-18 LAB — URINALYSIS, MICROSCOPIC (REFLEX): Bacteria, UA: NONE SEEN

## 2024-04-18 LAB — CBG MONITORING, ED: Glucose-Capillary: 103 mg/dL — ABNORMAL HIGH (ref 70–99)

## 2024-04-18 LAB — GC/CHLAMYDIA PROBE AMP (~~LOC~~) NOT AT ARMC
Chlamydia: NEGATIVE
Comment: NEGATIVE
Comment: NORMAL
Neisseria Gonorrhea: NEGATIVE

## 2024-04-18 LAB — HCG, QUANTITATIVE, PREGNANCY: hCG, Beta Chain, Quant, S: 117362 m[IU]/mL — ABNORMAL HIGH

## 2024-04-18 MED ORDER — PROCHLORPERAZINE EDISYLATE 10 MG/2ML IJ SOLN
10.0000 mg | Freq: Once | INTRAMUSCULAR | Status: AC
  Administered 2024-04-18: 10 mg via INTRAVENOUS
  Filled 2024-04-18: qty 2

## 2024-04-18 MED ORDER — ONDANSETRON 4 MG PO TBDP
4.0000 mg | ORAL_TABLET | Freq: Once | ORAL | Status: AC
  Administered 2024-04-18: 4 mg via ORAL
  Filled 2024-04-18: qty 1

## 2024-04-18 MED ORDER — OXYCODONE HCL 5 MG PO TABS
5.0000 mg | ORAL_TABLET | Freq: Once | ORAL | Status: AC
  Administered 2024-04-18: 5 mg via ORAL
  Filled 2024-04-18: qty 1

## 2024-04-18 MED ORDER — ACETAMINOPHEN 500 MG PO TABS
1000.0000 mg | ORAL_TABLET | Freq: Once | ORAL | Status: AC
  Administered 2024-04-18: 1000 mg via ORAL
  Filled 2024-04-18: qty 2

## 2024-04-18 MED ORDER — DIPHENHYDRAMINE HCL 50 MG/ML IJ SOLN
25.0000 mg | Freq: Once | INTRAMUSCULAR | Status: AC
  Administered 2024-04-18: 25 mg via INTRAVENOUS
  Filled 2024-04-18: qty 1

## 2024-04-18 MED ORDER — AMMONIA AROMATIC IN INHA
RESPIRATORY_TRACT | Status: AC
  Filled 2024-04-18: qty 10

## 2024-04-18 MED ORDER — SODIUM CHLORIDE 0.9 % IV BOLUS
1000.0000 mL | Freq: Once | INTRAVENOUS | Status: AC
  Administered 2024-04-18: 1000 mL via INTRAVENOUS

## 2024-04-18 NOTE — ED Provider Triage Note (Signed)
 Emergency Medicine Provider Triage Evaluation Note  Marcia Hanson  , a 44 y.o. female  was evaluated in triage.  Pt complains of right sided abdominal pain and back pain.  Seen at MAU and passed out in their discharge lounge after being discharged.  Sent to ED for further evaluation.  [redacted] weeks along.   Review of Systems  Positive: Syncope, abdominal pain, dizziness Negative: Vaginal bleeding  Physical Exam  BP (!) 137/92 (BP Location: Right Arm)   Pulse 91   Temp (!) 97.5 F (36.4 C) (Oral)   Resp 18   Ht 5' 5 (1.651 m)   Wt 65.2 kg   LMP 02/24/2024 (Exact Date)   SpO2 98%   BMI 23.93 kg/m  Gen:   Awake, no distress   Resp:  Normal effort  MSK:   Moves extremities without difficulty  Other:    Medical Decision Making  Medically screening exam initiated at 5:25 AM.  Appropriate orders placed.  Essa Goyer  was informed that the remainder of the evaluation will be completed by another provider, this initial triage assessment does not replace that evaluation, and the importance of remaining in the ED until their evaluation is complete.     Vicky Charleston, PA-C 04/18/24 [phone]

## 2024-04-18 NOTE — MAU Note (Signed)
 At 0451 RN went into Family Rm and the pt was lying on the floor. She had been d/c at 0429 and stated someone could come and get her for d/c. Pt was initially unresponsive. Harlene Duncans CNM by pt. Turned pt over and she responded but could not tell us  where she was. When asked about pain she pointed to her abdomen which was an initial pain when she came to MAU. David with [facility] called and at bedside at 0455. Pt was able to sit up with help and then stand and walk to a stretcher. While on the stretcher she was able to tell us  her correctly her location and DOB. Pt was taken to Main ED by stretcher by Alm, [facility] RN and [facility] RN. She flowsheet for v/s

## 2024-04-18 NOTE — ED Notes (Signed)
 PA @ bedside.

## 2024-04-18 NOTE — MAU Note (Signed)
 Marcia Hanson  is a 44 y.o. at Unknown here in MAU reporting severe cramps RLQ of abdomen and lower back. Pain comes and goes. Gets hot and cold and light headed. No vag bleeding. Pt was seen at [facility] yesterday at her PCP and had positive upt. Reports a h/a all day.   LMP: 02/24/24 Onset of complaint: tonight Pain score: 8 for stomach and back and 6 for h/a Vitals:   04/18/24 0026 04/18/24 0029  BP:  135/83  Pulse: 89   Resp: 18   Temp: 98.1 F (36.7 C)   SpO2: 100%      FHT: na  Lab orders placed from triage: u/a

## 2024-04-18 NOTE — Progress Notes (Signed)
 Written and verbal d/c instructions given and pt voiced understanding.

## 2024-04-18 NOTE — MAU Provider Note (Signed)
 " History     CSN: [id number]  Arrival date and time: 04/18/24 0004   Event Date/Time   First Provider Initiated Contact with Patient 04/18/24 [phone]      Chief Complaint  Patient presents with   Abdominal Pain    Marcia Hanson  is a 44 y.o. [id number] at [redacted]w[redacted]d by LMP of November 16th who has not yet established care.  She presents today for abdominal pain.  She states the pain started today and has been intermittent.  She states the pain is cramping that starts in her RLQ and radiates to her back.  She states the pain is improved with laying in the fetal position, but has no worsening factors.  She rates the pain a 7/10.  No issues with urination, constipation, or diarrhea.   OB History     Gravida  6   Para  0   Term  0   Preterm  0   AB  4   Living  0      SAB  4   IAB  0   Ectopic  0   Multiple  0   Live Births  0           Past Medical History:  Diagnosis Date   Fatigue    Hypokalemia    Iron deficiency    Syncope     Past Surgical History:  Procedure Laterality Date   NO PAST SURGERIES      Family History  Problem Relation Age of Onset   Diabetes Maternal Grandmother    Hypertension Maternal Grandmother    Stroke Maternal Grandfather    Diabetes Maternal Grandfather    Hypertension Maternal Grandfather    Dementia Maternal Grandfather     Social History[1]  Allergies: Allergies[2]  Medications Prior to Admission  Medication Sig Dispense Refill Last Dose/Taking   cholecalciferol (VITAMIN D3) 25 MCG (1000 UNIT) tablet Take 1,000 Units by mouth daily.   Taking   cyanocobalamin (VITAMIN B12) 500 MCG tablet Take 500 mcg by mouth daily.   Taking   folic acid (FOLVITE) 1 MG tablet Take 1 mg by mouth daily.   Taking   Blood Pressure Monitoring (BLOOD PRESSURE KIT) DEVI 1 Device by Does not apply route once a week. 1 each 0    Galcanezumab -gnlm (EMGALITY ) 120 MG/ML SOAJ Inject 120 mg into the skin every 28 (twenty-eight) days. 1.12 mL 11  More than a month   meclizine  (ANTIVERT ) 25 MG tablet Take 1 tablet (25 mg total) by mouth 3 (three) times daily as needed for dizziness. (Patient not taking: Reported on 04/18/2024) 30 tablet 0 Not Taking    Review of Systems  Gastrointestinal:  Positive for abdominal pain. Negative for constipation, diarrhea, nausea and vomiting.  Genitourinary:  Negative for difficulty urinating, dysuria, vaginal bleeding and vaginal discharge.  Musculoskeletal:  Positive for back pain.   Physical Exam   Blood pressure 135/83, pulse 89, temperature 98.1 F (36.7 C), resp. rate 18, height 5' 5 (1.651 m), weight 65.2 kg, last menstrual period 02/24/2024, SpO2 100%, unknown if currently breastfeeding.  Physical Exam Vitals and nursing note reviewed.  Constitutional:      Appearance: She is well-developed.  HENT:     Head: Normocephalic and atraumatic.  Eyes:     Conjunctiva/sclera: Conjunctivae normal.  Cardiovascular:     Rate and Rhythm: Normal rate.  Pulmonary:     Effort: Pulmonary effort is normal. No respiratory distress.  Abdominal:     Tenderness: There  is abdominal tenderness in the right lower quadrant.  Musculoskeletal:        General: Normal range of motion.     Cervical back: Normal range of motion.  Skin:    General: Skin is warm and dry.  Neurological:     Mental Status: She is alert and oriented to person, place, and time.  Psychiatric:        Mood and Affect: Mood normal.        Behavior: Behavior normal.     MAU Course  Procedures Results for orders placed or performed during the hospital encounter of 04/18/24 (from the past 24 hours)  Urinalysis, Routine w reflex microscopic -Urine, Clean Catch     Status: Abnormal   Collection Time: 04/18/24 12:40 AM  Result Value Ref Range   Color, Urine YELLOW YELLOW   APPearance HAZY (A) CLEAR   Specific Gravity, Urine 1.025 1.005 - 1.030   pH 6.0 5.0 - 8.0   Glucose, UA NEGATIVE NEGATIVE mg/dL   Hgb urine dipstick TRACE (A)  NEGATIVE   Bilirubin Urine NEGATIVE NEGATIVE   Ketones, ur NEGATIVE NEGATIVE mg/dL   Protein, ur 899 (A) NEGATIVE mg/dL   Nitrite NEGATIVE NEGATIVE   Leukocytes,Ua NEGATIVE NEGATIVE  Urinalysis, Microscopic (reflex)     Status: None   Collection Time: 04/18/24 12:40 AM  Result Value Ref Range   RBC / HPF 6-10 0 - 5 RBC/hpf   WBC, UA 0-5 0 - 5 WBC/hpf   Bacteria, UA NONE SEEN NONE SEEN   Squamous Epithelial / HPF 0-5 0 - 5 /HPF   Mucus PRESENT    Hyaline Casts, UA PRESENT   Wet prep, genital     Status: None   Collection Time: 04/18/24 12:48 AM   Specimen: PATH Cytology Cervicovaginal Ancillary Only  Result Value Ref Range   Yeast Wet Prep HPF POC NONE SEEN NONE SEEN   Trich, Wet Prep NONE SEEN NONE SEEN   Clue Cells Wet Prep HPF POC NONE SEEN NONE SEEN   WBC, Wet Prep HPF POC <10 <10   Sperm NONE SEEN   CBC     Status: None   Collection Time: 04/18/24 12:57 AM  Result Value Ref Range   WBC 6.2 4.0 - 10.5 K/uL   RBC 4.65 3.87 - 5.11 MIL/uL   Hemoglobin 13.9 12.0 - 15.0 g/dL   HCT 58.1 63.9 - 53.9 %   MCV 89.9 80.0 - 100.0 fL   MCH 29.9 26.0 - 34.0 pg   MCHC 33.3 30.0 - 36.0 g/dL   RDW 85.6 88.4 - 84.4 %   Platelets 302 150 - 400 K/uL   nRBC 0.0 0.0 - 0.2 %  hCG, quantitative, pregnancy     Status: Abnormal   Collection Time: 04/18/24 12:57 AM  Result Value Ref Range   hCG, Beta Chain, Quant, S 117,362 (H) <5 mIU/mL  Comprehensive metabolic panel with GFR     Status: Abnormal   Collection Time: 04/18/24 12:57 AM  Result Value Ref Range   Sodium 135 135 - 145 mmol/L   Potassium 3.9 3.5 - 5.1 mmol/L   Chloride 102 98 - 111 mmol/L   CO2 23 22 - 32 mmol/L   Glucose, Bld 102 (H) 70 - 99 mg/dL   BUN 8 6 - 20 mg/dL   Creatinine, Ser 9.34 0.44 - 1.00 mg/dL   Calcium 9.2 8.9 - 89.6 mg/dL   Total Protein 7.5 6.5 - 8.1 g/dL   Albumin 4.1 3.5 -  5.0 g/dL   AST 20 15 - 41 U/L   ALT 13 0 - 44 U/L   Alkaline Phosphatase 68 38 - 126 U/L   Total Bilirubin 0.3 0.0 - 1.2 mg/dL    GFR, Estimated >39 >39 mL/min   Anion gap 9 5 - 15   US  OB Comp Less 14 Wks Result Date: 04/18/2024 EXAM: OBSTETRIC ULTRASOUND FIRST TRIMESTER TECHNIQUE: Transvaginal first trimester obstetric pelvic duplex ultrasound was performed with real-time imaging, color flow Doppler imaging, and spectral analysis. COMPARISON: None available. CLINICAL HISTORY: Abdominal pain during pregnancy. Right lower quadrant pain today. Quantitative HCG is pending. Estimated gestational age by LMP is 7 weeks 5 days. FINDINGS: UTERUS: The uterus is retroverted. An anterior uterine mass consistent with a fibroid measuring 3 cm in maximal diameter. GESTATIONAL SAC(S): A single uterine gestational sac is present. No subchorionic hemorrhage. YOLK SAC: The yolk sac is present. EMBRYO(<11WK) /FETUS(>=11WK): The fetal pole is present. Fetal cardiac activity is observed. CROWN RUMP LENGTH: Crown rump length measures 18 mm consistent with an estimated gestational age of [redacted] weeks 2 days and an estimated delivery date of 11/26/2024. RATE OF CARDIAC ACTIVITY: Fetal heart rate measures 164 beats per minute. RIGHT OVARY: The right ovary is not seen due to overlying bowel gas. LEFT OVARY: The left ovary is visualized and appears normal. Normal arterial and venous flow. FREE FLUID: No free fluid in the pelvis. MEASUREMENTS ESTIMATED GESTATIONAL AGE BY CURRENT ULTRASOUND: 8 weeks 2 days ESTIMATED GESTATIONAL AGE BY LMP/PRIOR ULTRASOUND: 7 weeks 5 days ESTIMATED DUE DATE: 11/26/2024 IMPRESSION: 1. Single intrauterine pregnancy. Estimated gestational age by current ultrasound is 8 weeks 2 days. 2. Right ovary not visualized due to overlying bowel gas. No free fluid in the pelvis. 3. Anterior uterine fibroid measuring 3 cm in maximal diameter. Electronically signed by: Elsie Gravely MD 04/18/2024 01:55 AM EST RP Workstation: [id number]    MDM Physical Exam Cultures: Wet Prep and GC/CT Labs: UA, UPT, CBC, CMP, hCG Ultrasound Pain  Medication Assessment and Plan  44 year old, [id number]  SIUP at 7.5 weeks Abdominal Pain  -Labs and US  ordered while patient in triage. -Results as above. -Discussed findings with patient. -Congratulations given. -Instructed to start prenatal care.  -Patient offered and accepts pain medication.  -Tylenol  ordered.  -Will reassess.   Marcia Hanson 04/18/2024, 2:29 AM   Reassessment (3:28 AM) -Patient reports no relief with tylenol . -Offered and accepts additional pain medication. -Will give oxycodone  5mg .   Reassessment (3:56 AM) -Discharge order placed. -Nurse to give instructions and precautions.   Marcia LITTIE Duncans MSN, CNM Advanced Practice Provider, [facility]   Addendum 5:04 AM -Patient found, face down, on floor of family room. -Nurse calls out and provider to bedside. -Patient not answering to name, but eyes open with nystagmus movements and lips quivering.  -Ammonia  inhalant obtained and nurse instructed to call [facility] RR Nurse. -Patient placed on back and feet elevated.  -BP  154/104, HR 87 -Patient not oriented, but responsive. Reports h/o elevated bp during pregnancy, but no h/o seizure.  -Patient assisted into sitting position, but remains slightly disoriented.  -RR Nurse-David to bedside and patient assisted to bed. Informed of transfer to [facility] for further evaluation.  -Dr. CHARM Kin contacted and informed of patient status, evaluation, interventions, and results. Accepts transfer.   Marcia LITTIE Duncans MSN, CNM Advanced Practice Provider, [facility]      [1]  Social History Tobacco Use   Smoking status: Never  Smokeless tobacco: Never  Vaping Use   Vaping status: Never Used  Substance Use Topics   Alcohol use: No   Drug use: No  [2]  Allergies Allergen Reactions   Avocado Rash   "

## 2024-04-18 NOTE — Discharge Instructions (Signed)
 Please follow-up with your family doctor or OB and cardiologist.  I have placed an order for the cardiologist to call you on the phone to try and set up an appointment.  Eat and drink as well as you can for the next few days.  Please return for sudden worsening pain fever inability to eat or drink or if you develop chest pain or difficulty breathing or sudden worsening headache.

## 2024-04-18 NOTE — ED Notes (Addendum)
 Pt brought to Adult Triage, Pt is alert and oriented able to transfer from stretcher to chair.  Pt reports she has passed out in the past.

## 2024-04-18 NOTE — ED Provider Notes (Signed)
 " [place] EMERGENCY DEPARTMENT AT [facility] Provider Note   CSN: [id number] Arrival date & time: 04/18/24  0004     Patient presents with: Abdominal Pain   Marcia Hanson  is a 44 y.o. female.   44 yo F with a chief complaints of syncopal event.  Patient tells me that she was in the heavy you being evaluated for some right lower quadrant abdominal discomfort in the setting of pregnancy.  She had been discharged and she remembers signing the paperwork and started to walk out and the next thing she remembers is that she was on the ground and people were talking to her.  She feels a bit better now.  Maybe a bit lightheaded.  Still having some right sided abdominal discomfort.  Still feels a bit dizzy.  She denies cough congestion or fever.  Feels like she has been eating and drinking normally.  Denies vomiting or diarrhea.   Abdominal Pain      Prior to Admission medications  Medication Sig Start Date End Date Taking? Authorizing Provider  cholecalciferol (VITAMIN D3) 25 MCG (1000 UNIT) tablet Take 1,000 Units by mouth daily.   Yes [provider]  cyanocobalamin (VITAMIN B12) 500 MCG tablet Take 500 mcg by mouth daily.   Yes [provider]  folic acid (FOLVITE) 1 MG tablet Take 1 mg by mouth daily.   Yes [provider]  Blood Pressure Monitoring (BLOOD PRESSURE KIT) DEVI 1 Device by Does not apply route once a week. 05/15/22   Constant, Peggy, MD    Allergies: Avocado    Review of Systems  Gastrointestinal:  Positive for abdominal pain.    Updated Vital Signs BP (!) 137/92 (BP Location: Right Arm)   Pulse 90   Temp (!) 97.5 F (36.4 C) (Oral)   Resp 19   Ht 5' 5 (1.651 m)   Wt 65.2 kg   LMP 02/24/2024 (Exact Date)   SpO2 100%   BMI 23.93 kg/m   Physical Exam Vitals and nursing note reviewed.  Constitutional:      General: She is not in acute distress.    Appearance: She is well-developed. She is not diaphoretic.  HENT:      Head: Normocephalic and atraumatic.  Eyes:     Pupils: Pupils are equal, round, and reactive to light.  Cardiovascular:     Rate and Rhythm: Normal rate and regular rhythm.     Heart sounds: No murmur heard.    No friction rub. No gallop.  Pulmonary:     Effort: Pulmonary effort is normal.     Breath sounds: No wheezing or rales.  Abdominal:     General: There is no distension.     Palpations: Abdomen is soft.     Tenderness: There is abdominal tenderness.  Musculoskeletal:        General: No tenderness.     Cervical back: Normal range of motion and neck supple.  Skin:    General: Skin is warm and dry.  Neurological:     Mental Status: She is alert and oriented to person, place, and time.  Psychiatric:        Behavior: Behavior normal.     (all labs ordered are listed, but only abnormal results are displayed) Labs Reviewed  URINALYSIS, ROUTINE W REFLEX MICROSCOPIC - Abnormal; Notable for the following components:      Result Value   APPearance HAZY (*)    Hgb urine dipstick TRACE (*)  Protein, ur 100 (*)    All other components within normal limits  HCG, QUANTITATIVE, PREGNANCY - Abnormal; Notable for the following components:   hCG, Beta Chain, Quant, [place] 882,637 (*)    All other components within normal limits  COMPREHENSIVE METABOLIC PANEL WITH GFR - Abnormal; Notable for the following components:   Glucose, Bld 102 (*)    All other components within normal limits  CBG MONITORING, ED - Abnormal; Notable for the following components:   Glucose-Capillary 103 (*)    All other components within normal limits  WET PREP, GENITAL  CBC  URINALYSIS, MICROSCOPIC (REFLEX)  GC/CHLAMYDIA PROBE AMP ([place]) NOT AT [facility]    EKG: EKG Interpretation Date/Time:  Friday April 18 2024 05:34:17 EST Ventricular Rate:  71 PR Interval:  180 QRS Duration:  84 QT Interval:  366 QTC Calculation: 397 R Axis:   99  Text Interpretation: Normal sinus rhythm Rightward axis no  wpw, prolonged qt or brugada Otherwise no significant change Confirmed by Emil Share [phone]) on 04/18/2024 5:46:53 AM  Radiology: US  OB Comp Less 14 Wks Result Date: 04/18/2024 EXAM: OBSTETRIC ULTRASOUND FIRST TRIMESTER TECHNIQUE: Transvaginal first trimester obstetric pelvic duplex ultrasound was performed with real-time imaging, color flow Doppler imaging, and spectral analysis. COMPARISON: None available. CLINICAL HISTORY: Abdominal pain during pregnancy. Right lower quadrant pain today. Quantitative HCG is pending. Estimated gestational age by LMP is 7 weeks 5 days. FINDINGS: UTERUS: The uterus is retroverted. An anterior uterine mass consistent with a fibroid measuring 3 cm in maximal diameter. GESTATIONAL SAC(S): A single uterine gestational sac is present. No subchorionic hemorrhage. YOLK SAC: The yolk sac is present. EMBRYO(<11WK) /FETUS(>=11WK): The fetal pole is present. Fetal cardiac activity is observed. CROWN RUMP LENGTH: Crown rump length measures 18 mm consistent with an estimated gestational age of [redacted] weeks 2 days and an estimated delivery date of 11/26/2024. RATE OF CARDIAC ACTIVITY: Fetal heart rate measures 164 beats per minute. RIGHT OVARY: The right ovary is not seen due to overlying bowel gas. LEFT OVARY: The left ovary is visualized and appears normal. Normal arterial and venous flow. FREE FLUID: No free fluid in the pelvis. MEASUREMENTS ESTIMATED GESTATIONAL AGE BY CURRENT ULTRASOUND: 8 weeks 2 days ESTIMATED GESTATIONAL AGE BY LMP/PRIOR ULTRASOUND: 7 weeks 5 days ESTIMATED DUE DATE: 11/26/2024 IMPRESSION: 1. Single intrauterine pregnancy. Estimated gestational age by current ultrasound is 8 weeks 2 days. 2. Right ovary not visualized due to overlying bowel gas. No free fluid in the pelvis. 3. Anterior uterine fibroid measuring 3 cm in maximal diameter. Electronically signed by: Elsie Gravely MD 04/18/2024 01:55 AM EST RP Workstation: [id number]     Procedures   Medications Ordered in  the ED  ammonia  inhalant (has no administration in time range)  prochlorperazine  (COMPAZINE ) injection 10 mg (has no administration in time range)  ondansetron  (ZOFRAN -ODT) disintegrating tablet 4 mg (4 mg Oral Given 04/18/24 0255)  acetaminophen  (TYLENOL ) tablet 1,000 mg (1,000 mg Oral Given 04/18/24 0254)  oxyCODONE  (Oxy IR/ROXICODONE ) immediate release tablet 5 mg (5 mg Oral Given 04/18/24 0336)  sodium chloride  0.9 % bolus 1,000 mL (1,000 mLs Intravenous New Bag/Given 04/18/24 0622)  diphenhydrAMINE  (BENADRYL ) injection 25 mg (25 mg Intravenous Given 04/18/24 9374)                                    Medical Decision Making Risk Prescription drug management.   44 yo F with a  cc of a syncopal event. Patient was in the MAU being evaluated for right lower quadrant abdominal discomforts while she is about [redacted] weeks pregnant.  Was put up for discharge and then lost consciousness in the waiting room.  All my record review the patient has actually been admitted to the hospital previously for syncope.  She is also been seen by both cardiology and neurology as an outpatient for episodes like this in the past.  She is feeling a bit better now.  Will give a bolus of IV fluids headache cocktail reassess.  She had lab work done in the MPU earlier, no anemia, no significant electrolyte abnormalities.  Ultrasound done in the MAU consistent with a 8-week 2-day pregnancy.  Right ovary was not visualized due to overlying bowel gas.  Patient given a bolus of IV fluids Benadryl  with some improvement of her headache.  She is feeling better and would like to go home.  6:59 AM:  I have discussed the diagnosis/risks/treatment options with the patient.  Evaluation and diagnostic testing in the emergency department does not suggest an emergent condition requiring admission or immediate intervention beyond what has been performed at this time.  They will follow up with PCP, Cards. We also discussed returning to the ED  immediately if new or worsening sx occur. We discussed the sx which are most concerning (e.g., sudden worsening pain, fever, inability to tolerate by mouth) that necessitate immediate return. Medications administered to the patient during their visit and any new prescriptions provided to the patient are listed below.  Medications given during this visit Medications  ammonia  inhalant (has no administration in time range)  prochlorperazine  (COMPAZINE ) injection 10 mg (has no administration in time range)  ondansetron  (ZOFRAN -ODT) disintegrating tablet 4 mg (4 mg Oral Given 04/18/24 0255)  acetaminophen  (TYLENOL ) tablet 1,000 mg (1,000 mg Oral Given 04/18/24 0254)  oxyCODONE  (Oxy IR/ROXICODONE ) immediate release tablet 5 mg (5 mg Oral Given 04/18/24 0336)  sodium chloride  0.9 % bolus 1,000 mL (1,000 mLs Intravenous New Bag/Given 04/18/24 0622)  diphenhydrAMINE  (BENADRYL ) injection 25 mg (25 mg Intravenous Given 04/18/24 9374)     The patient appears reasonably screen and/or stabilized for discharge and I doubt any other medical condition or other [facility] requiring further screening, evaluation, or treatment in the ED at this time prior to discharge.       Final diagnoses:  Abdominal pain in pregnancy, first trimester  [redacted] weeks gestation of pregnancy  Syncope and collapse    ED Discharge Orders          Ordered    Discharge patient        04/18/24 0354    Ambulatory referral to Cardiology       Comments: If you have not heard from the Cardiology office within the next 72 hours please call [phone].   04/18/24 0658               Emil Share, DO 04/18/24 9340  "

## 2024-05-08 ENCOUNTER — Telehealth: Payer: Self-pay | Admitting: *Deleted

## 2024-05-08 DIAGNOSIS — O0990 Supervision of high risk pregnancy, unspecified, unspecified trimester: Secondary | ICD-10-CM | POA: Insufficient documentation

## 2024-05-08 DIAGNOSIS — D508 Other iron deficiency anemias: Secondary | ICD-10-CM

## 2024-05-08 DIAGNOSIS — R55 Syncope and collapse: Secondary | ICD-10-CM

## 2024-05-08 DIAGNOSIS — O09529 Supervision of elderly multigravida, unspecified trimester: Secondary | ICD-10-CM | POA: Insufficient documentation

## 2024-05-08 NOTE — Progress Notes (Signed)
 New OB Intake  I connected with Marcia Hanson   on 05/08/24 at  1:15 PM EST by MyChart Video Visit and verified that I am speaking with the correct person using two identifiers. Nurse is located at [facility] and pt is located at hotel.  I discussed the limitations, risks, security and privacy concerns of performing an evaluation and management service by telephone and the availability of in person appointments. I also discussed with the patient that there may be a patient responsible charge related to this service. The patient expressed understanding and agreed to proceed.  I explained I am completing New OB Intake today. We discussed EDD of 11/30/24 based on LMP of 02/24/24; consistent with US  at [redacted]w[redacted]d. Pt is G5P0040. I reviewed her allergies, medications and Medical/Surgical/OB history.    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 Discussed the nature of our practice with multiple providers including residents and students as well as female and female providers. Due to the size of the practice, the delivering provider may not be the same as those providing prenatal care.   Patient is interested in water birth.  MyChart/Babyscripts MyChart access verified. I explained pt will have some visits in office and some virtually. Babyscripts instructions given and order placed.   Blood Pressure Cuff/Weight Scale She has a BP cuff.  Explained after first prenatal appt pt will check weekly and document in Babyscripts. Patient does have weight scale.  Anatomy US  Explained first scheduled US  will be around  19 weeks. Anatomy US  scheduled for 07/10/24 at 1:00.  Is patient a CenteringPregnancy candidate?  Declined Declined due to Enrolled in [facility] If not accepted into [facility] would like to do centering.    Is patient a Mom+Baby Combined Care candidate?  Accepted.     Is patient a candidate for Babyscripts Optimization? No, due to [facility]   First visit review I reviewed new OB appt with patient. Explained pt will be seen by Dr. Nicholaus at first visit. Discussed [name] genetic screening with patient. She would like both [employer] and [employer].drawn with routine prenatal labs at new ob visit.    Last Pap No results found for: EDMON Rock Skip OBIE 05/08/2024  1:34 PM

## 2024-05-13 ENCOUNTER — Encounter: Payer: Self-pay | Admitting: Obstetrics and Gynecology

## 2024-05-13 ENCOUNTER — Other Ambulatory Visit: Payer: Self-pay

## 2024-05-13 ENCOUNTER — Other Ambulatory Visit: Payer: Self-pay | Admitting: Obstetrics and Gynecology

## 2024-05-13 ENCOUNTER — Ambulatory Visit: Payer: Self-pay | Admitting: Obstetrics and Gynecology

## 2024-05-13 VITALS — BP 131/84 | HR 80 | Wt 143.0 lb

## 2024-05-13 DIAGNOSIS — O09521 Supervision of elderly multigravida, first trimester: Secondary | ICD-10-CM

## 2024-05-13 DIAGNOSIS — D509 Iron deficiency anemia, unspecified: Secondary | ICD-10-CM

## 2024-05-13 DIAGNOSIS — O0990 Supervision of high risk pregnancy, unspecified, unspecified trimester: Secondary | ICD-10-CM

## 2024-05-13 DIAGNOSIS — N898 Other specified noninflammatory disorders of vagina: Secondary | ICD-10-CM

## 2024-05-13 MED ORDER — ASPIRIN 81 MG PO TBEC: 81.0000 mg | DELAYED_RELEASE_TABLET | Freq: Every day | ORAL | 2 refills | Status: DC

## 2024-05-13 NOTE — Patient Instructions (Addendum)
   Considering Waterbirth? Guide for patients at [facility]) Why consider waterbirth? Gentle birth for babies  Less pain medicine used in labor  May allow for passive descent/less pushing  May reduce perineal tears  More mobility and instinctive maternal position changes  Increased maternal relaxation   Is waterbirth safe? What are the risks of infection, drowning or other complications? Infection:  Very low risk (3.7 % for tub vs 4.8% for bed)  7 in 8000 waterbirths with documented infection  Poorly cleaned equipment most common cause  Slightly lower group B strep transmission rate  Drowning  Maternal:  Very low risk  Related to seizures or fainting  Newborn:  Very low risk. No evidence of increased risk of respiratory problems in multiple large studies  Physiological protection from breathing under water  Avoid underwater birth if there are any fetal complications  Once baby's head is out of the water, keep it out.  Birth complication  Some reports of cord trauma, but risk decreased by bringing baby to surface gradually  No evidence of increased risk of shoulder dystocia. Mothers can usually change positions faster in water than in a bed, possibly aiding the maneuvers to free the shoulder.   There are 2 things you MUST do to have a waterbirth with [facility]: Attend a waterbirth class at [facility] at [facility]   3rd Wednesday of every month from 7-9 pm (virtual during COVID) [employer] at [email or web address] or [email or web address] or by calling [phone] Bring us  the certificate from the class to your prenatal appointment or send via MyChart Meet with a midwife at 36 weeks* to see if you can still plan a waterbirth and to sign the consent.   *We also recommend that you schedule as many of your prenatal visits with a midwife as possible.    Helpful information: You may want to bring a bathing suit top to the hospital  to wear during labor but this is optional.  All other supplies are provided by the hospital. Please arrive at the hospital with signs of active labor, and do not wait at home until late in labor. It takes 45 min- 1 hour for fetal monitoring, and check in to your room to take place, plus transport and filling of the waterbirth tub.    Things that would prevent you from having a waterbirth: Premature, <37wks  Previous cesarean birth  Presence of thick meconium-stained fluid  Multiple gestation (Twins, triplets, etc.)  Uncontrolled diabetes or gestational diabetes requiring medication  Hypertension diagnosed in pregnancy or preexisting hypertension (gestational hypertension, preeclampsia, or chronic hypertension) Fetal growth restriction (your baby measures less than 10th percentile on ultrasound) Heavy vaginal bleeding  Non-reassuring fetal heart rate  Active infection (MRSA, etc.). Group B Strep is NOT a contraindication for waterbirth.  If your labor has to be induced and induction method requires continuous monitoring of the baby's heart rate  Other risks/issues identified by your obstetrical provider   Please remember that birth is unpredictable. Under certain unforeseeable circumstances your provider may advise against giving birth in the tub. These decisions will be made on a case-by-case basis and with the safety of you and your baby as our highest priority.    Updated 07/13/21

## 2024-05-14 ENCOUNTER — Ambulatory Visit: Payer: Self-pay | Admitting: Obstetrics and Gynecology

## 2024-05-14 LAB — HCV INTERPRETATION

## 2024-05-14 LAB — IRON,TIBC AND FERRITIN PANEL
Ferritin: 147 ng/mL (ref 15–150)
Iron Saturation: 45 % (ref 15–55)
Iron: 113 ug/dL (ref 27–159)
Total Iron Binding Capacity: 249 ug/dL — ABNORMAL LOW (ref 250–450)
UIBC: 136 ug/dL (ref 131–425)

## 2024-05-14 LAB — CBC/D/PLT+RPR+RH+ABO+RUBIGG...
Antibody Screen: NEGATIVE
Basophils Absolute: 0 10*3/uL (ref 0.0–0.2)
Basos: 1 %
EOS (ABSOLUTE): 0.3 10*3/uL (ref 0.0–0.4)
Eos: 4 %
HCV Ab: NONREACTIVE
HIV Screen 4th Generation wRfx: NONREACTIVE
Hematocrit: 43.9 % (ref 34.0–46.6)
Hemoglobin: 14.3 g/dL (ref 11.1–15.9)
Hepatitis B Surface Ag: NEGATIVE
Immature Grans (Abs): 0 10*3/uL (ref 0.0–0.1)
Immature Granulocytes: 0 %
Lymphocytes Absolute: 1.8 10*3/uL (ref 0.7–3.1)
Lymphs: 27 %
MCH: 30.5 pg (ref 26.6–33.0)
MCHC: 32.6 g/dL (ref 31.5–35.7)
MCV: 94 fL (ref 79–97)
Monocytes Absolute: 0.3 10*3/uL (ref 0.1–0.9)
Monocytes: 5 %
Neutrophils Absolute: 4.4 10*3/uL (ref 1.4–7.0)
Neutrophils: 63 %
Platelets: 280 10*3/uL (ref 150–450)
RBC: 4.69 x10E6/uL (ref 3.77–5.28)
RDW: 12.8 % (ref 11.7–15.4)
RPR Ser Ql: NONREACTIVE
Rh Factor: POSITIVE
Rubella Antibodies, IGG: 6.05 {index}
WBC: 6.8 10*3/uL (ref 3.4–10.8)

## 2024-05-14 LAB — VITAMIN D 25 HYDROXY (VIT D DEFICIENCY, FRACTURES): Vit D, 25-Hydroxy: 17.3 ng/mL — ABNORMAL LOW (ref 30.0–100.0)

## 2024-05-14 LAB — TSH RFX ON ABNORMAL TO FREE T4: TSH: 0.602 u[IU]/mL (ref 0.450–4.500)

## 2024-05-14 LAB — HEMOGLOBIN A1C
Est. average glucose Bld gHb Est-mCnc: 108 mg/dL
Hgb A1c MFr Bld: 5.4 % (ref 4.8–5.6)

## 2024-05-15 ENCOUNTER — Encounter (HOSPITAL_COMMUNITY): Payer: Self-pay | Admitting: Obstetrics & Gynecology

## 2024-05-15 ENCOUNTER — Inpatient Hospital Stay (HOSPITAL_COMMUNITY)

## 2024-05-15 ENCOUNTER — Observation Stay (HOSPITAL_COMMUNITY)
Admission: AD | Admit: 2024-05-15 | Discharge: 2024-05-16 | Disposition: A | Source: Home / Self Care | Attending: Obstetrics & Gynecology | Admitting: Obstetrics & Gynecology

## 2024-05-15 DIAGNOSIS — O034 Incomplete spontaneous abortion without complication: Principal | ICD-10-CM | POA: Diagnosis present

## 2024-05-15 DIAGNOSIS — D62 Acute posthemorrhagic anemia: Secondary | ICD-10-CM | POA: Diagnosis present

## 2024-05-15 LAB — CBC
HCT: 28.5 % — ABNORMAL LOW (ref 36.0–46.0)
Hemoglobin: 9.7 g/dL — ABNORMAL LOW (ref 12.0–15.0)
MCH: 30.9 pg (ref 26.0–34.0)
MCHC: 34 g/dL (ref 30.0–36.0)
MCV: 90.8 fL (ref 80.0–100.0)
Platelets: 299 10*3/uL (ref 150–400)
RBC: 3.14 MIL/uL — ABNORMAL LOW (ref 3.87–5.11)
RDW: 13 % (ref 11.5–15.5)
WBC: 11.2 10*3/uL — ABNORMAL HIGH (ref 4.0–10.5)
nRBC: 0 % (ref 0.0–0.2)

## 2024-05-15 LAB — WET PREP, GENITAL
Clue Cells Wet Prep HPF POC: NONE SEEN
Sperm: NONE SEEN
Trich, Wet Prep: NONE SEEN
WBC, Wet Prep HPF POC: >=10 — AB
Yeast Wet Prep HPF POC: NONE SEEN

## 2024-05-15 LAB — URINALYSIS, ROUTINE W REFLEX MICROSCOPIC
Bilirubin Urine: NEGATIVE
Glucose, UA: NEGATIVE mg/dL
Ketones, ur: NEGATIVE mg/dL
Leukocytes,Ua: NEGATIVE
Nitrite: NEGATIVE
Protein, ur: 100 mg/dL — AB
Specific Gravity, Urine: 1.03 (ref 1.005–1.030)
pH: 5 (ref 5.0–8.0)

## 2024-05-15 LAB — ABO/RH: ABO/RH(D): O POS

## 2024-05-15 LAB — CULTURE, OB URINE

## 2024-05-15 LAB — CERVICOVAGINAL ANCILLARY ONLY
Bacterial Vaginitis (gardnerella): NEGATIVE
Candida Glabrata: NEGATIVE
Candida Vaginitis: POSITIVE — AB
Comment: NEGATIVE
Comment: NEGATIVE
Comment: NEGATIVE

## 2024-05-15 LAB — HCG, QUANTITATIVE, PREGNANCY: hCG, Beta Chain, Quant, S: 35071 m[IU]/mL — ABNORMAL HIGH

## 2024-05-15 LAB — URINE CULTURE, OB REFLEX

## 2024-05-15 MED ORDER — LACTATED RINGERS IV SOLN: INTRAVENOUS | Status: DC

## 2024-05-15 MED ORDER — LACTATED RINGERS IV BOLUS
1000.0000 mL | Freq: Once | INTRAVENOUS | Status: AC
  Administered 2024-05-15: 1000 mL via INTRAVENOUS

## 2024-05-15 MED ORDER — ONDANSETRON HCL 4 MG PO TABS: 4.0000 mg | ORAL_TABLET | Freq: Four times a day (QID) | ORAL | Status: DC | PRN

## 2024-05-15 MED ORDER — ONDANSETRON HCL 4 MG/2ML IJ SOLN: 4.0000 mg | Freq: Four times a day (QID) | INTRAMUSCULAR | Status: DC | PRN

## 2024-05-15 NOTE — MAU Note (Signed)
 Marcia Hanson  is a 44 y.o. at [redacted]w[redacted]d here in MAU reporting having heavy vag bleeding last night and severe cramping. Thinks she had a miscarriage. Now pt reports having small amt bleeding but feels weak, clammy, and short of breath when she is active. She is ok when sitting. Took Ibuprofen  800mg  at 1700. Took Zofran  as well.   LMP: 02/24/24 Onset of complaint: Weds night Pain score: 6 Vitals:   05/15/24 2001  BP: 111/80  Pulse: 86  Resp: 17  Temp: 98.8 F (37.1 C)  SpO2: 100%     FHT: did not listen  Lab orders placed from triage: u/a

## 2024-05-15 NOTE — Progress Notes (Signed)
 VAST consult. Arrived to pt room. RN notified VAST PIV has been placed. Powell Bowler, RN VAST

## 2024-05-15 NOTE — MAU Provider Note (Signed)
 " History     CSN: [id number]  Arrival date and time: 05/15/24 1717   Event Date/Time   First Provider Initiated Contact with Patient 05/15/24 2235      Chief Complaint  Patient presents with   Vaginal Bleeding   Abdominal Pain   Daly Feltes  , a  44 y.o. [id number] at [redacted]w[redacted]d presents to MAU with complaints of vaginal bleeding and feeling lightheaded, dizzy and SOB. Patient states that she began bleeding last night. Saturating  ~10 pads last night with large clots with 9/10 abdominal cramping and pain. Endorses taking ibuprofen  and Zofran  last dosing just prior to arrival to MAU. She reports bleeding now is minimal, and pain is currently at a 2/10. Denies denies nausea at this time. Reports was dx'd with a yeast infection 2 days ago but has not started treatment. Denies urinary symptoms. Reports feeling dizzy, sweaty and clammy. Endorses SOB on exertion but at rest is normal.         Vaginal Bleeding The patient's pertinent negatives include no pelvic pain or vaginal discharge. Associated symptoms include abdominal pain. Pertinent negatives include no back pain, chills, constipation, diarrhea, dysuria, fever, headaches, nausea or vomiting.  Abdominal Pain Pertinent negatives include no constipation, diarrhea, dysuria, fever, headaches, nausea or vomiting.    OB History     Gravida  5   Para  0   Term  0   Preterm  0   AB  4   Living  0      SAB  4   IAB  0   Ectopic  0   Multiple  0   Live Births  0           Past Medical History:  Diagnosis Date   Fatigue    Hypokalemia    Iron deficiency    Syncope     Past Surgical History:  Procedure Laterality Date   DILATION AND CURETTAGE OF UTERUS     NO PAST SURGERIES      Family History  Problem Relation Age of Onset   Hypertension Mother    Arthritis Father    Diabetes Maternal Grandmother    Hypertension Maternal Grandmother    Stroke Maternal Grandfather    Diabetes Maternal Grandfather     Hypertension Maternal Grandfather    Dementia Maternal Grandfather     Social History[1]  Allergies: Allergies[2]  Medications Prior to Admission  Medication Sig Dispense Refill Last Dose/Taking   ibuprofen  (ADVIL ) 800 MG tablet Take 800 mg by mouth every 8 (eight) hours as needed.   05/15/2024 at  5:00 PM   ondansetron  (ZOFRAN -ODT) 4 MG disintegrating tablet Take 4 mg by mouth every 8 (eight) hours as needed for nausea or vomiting.   05/15/2024 at  5:00 PM   aspirin  EC 81 MG tablet Take 1 tablet (81 mg total) by mouth at bedtime. Start taking when you are [redacted] weeks pregnant for rest of pregnancy for prevention of preeclampsia 300 tablet 2    Blood Pressure Monitoring (BLOOD PRESSURE KIT) DEVI 1 Device by Does not apply route once a week. 1 each 0    cholecalciferol (VITAMIN D3) 25 MCG (1000 UNIT) tablet Take 1,000 Units by mouth daily.      cyanocobalamin (VITAMIN B12) 500 MCG tablet Take 500 mcg by mouth daily.      folic acid (FOLVITE) 1 MG tablet Take 1 mg by mouth daily.      Prenatal MV & Min w/FA-DHA (PRENATAL GUMMIES PO) Take  2 tablets by mouth daily at 6 (six) AM.      triamcinolone  (KENALOG ) 0.025 % ointment Apply 1 Application topically 2 (two) times daily.      Vitamin D, Ergocalciferol, (DRISDOL) 1.25 MG (50000 UNIT) CAPS capsule Take 50,000 Units by mouth every 7 (seven) days.       Review of Systems  Constitutional:  Negative for chills, fatigue and fever.  Eyes:  Negative for pain and visual disturbance.  Respiratory:  Positive for shortness of breath. Negative for apnea and wheezing.   Cardiovascular:  Negative for chest pain and palpitations.  Gastrointestinal:  Positive for abdominal pain. Negative for constipation, diarrhea, nausea and vomiting.  Genitourinary:  Positive for vaginal bleeding. Negative for difficulty urinating, dysuria, pelvic pain, vaginal discharge and vaginal pain.  Musculoskeletal:  Negative for back pain.  Neurological:  Positive for dizziness and  weakness. Negative for seizures and headaches.  Psychiatric/Behavioral:  Negative for suicidal ideas.    Physical Exam   Blood pressure 111/80, pulse 86, temperature 98.8 F (37.1 C), resp. rate 17, height 5' 5 (1.651 m), weight 62.6 kg, last menstrual period 02/24/2024, SpO2 100%, unknown if currently breastfeeding.  Physical Exam Vitals and nursing note reviewed.  Constitutional:      General: She is not in acute distress.    Appearance: Normal appearance.  HENT:     Head: Normocephalic.  Pulmonary:     Effort: Pulmonary effort is normal.  Abdominal:     General: Abdomen is flat.     Palpations: Abdomen is soft.     Tenderness: There is no abdominal tenderness.  Genitourinary:    Comments: Cervix pointed anteriorly but appears to be closed. Bleeding in vaginal vault cleared with 2 fox swabs. No clots or notable POC that could be easily removed.   Current pad with light bleeding  Musculoskeletal:     Cervical back: Normal range of motion.  Skin:    General: Skin is warm and dry.  Neurological:     Mental Status: She is alert and oriented to person, place, and time.  Psychiatric:        Mood and Affect: Mood normal.     MAU Course  Procedures Orders Placed This Encounter  Procedures   Wet prep, genital   US  OB Comp Less 14 Wks   Urinalysis, Routine w reflex microscopic -Urine, Clean Catch   CBC   hCG, quantitative, pregnancy   Diet NPO time specified   ABO/Rh   Insert peripheral IV   Results for orders placed or performed during the hospital encounter of 05/15/24 (from the past 24 hours)  Urinalysis, Routine w reflex microscopic -Urine, Clean Catch     Status: Abnormal   Collection Time: 05/15/24  8:15 PM  Result Value Ref Range   Color, Urine YELLOW YELLOW   APPearance CLOUDY (A) CLEAR   Specific Gravity, Urine 1.030 1.005 - 1.030   pH 5.0 5.0 - 8.0   Glucose, UA NEGATIVE NEGATIVE mg/dL   Hgb urine dipstick LARGE (A) NEGATIVE   Bilirubin Urine NEGATIVE  NEGATIVE   Ketones, ur NEGATIVE NEGATIVE mg/dL   Protein, ur 899 (A) NEGATIVE mg/dL   Nitrite NEGATIVE NEGATIVE   Leukocytes,Ua NEGATIVE NEGATIVE   RBC / HPF 0-5 0 - 5 RBC/hpf   WBC, UA 6-10 0 - 5 WBC/hpf   Bacteria, UA RARE (A) NONE SEEN   Squamous Epithelial / HPF 11-20 0 - 5 /HPF   Mucus PRESENT    Hyaline Casts, UA PRESENT  Wet prep, genital     Status: Abnormal   Collection Time: 05/15/24  8:53 PM  Result Value Ref Range   Yeast Wet Prep HPF POC NONE SEEN NONE SEEN   Trich, Wet Prep NONE SEEN NONE SEEN   Clue Cells Wet Prep HPF POC NONE SEEN NONE SEEN   WBC, Wet Prep HPF POC >=10 (A) <10   Sperm NONE SEEN   CBC     Status: Abnormal   Collection Time: 05/15/24  9:06 PM  Result Value Ref Range   WBC 11.2 (H) 4.0 - 10.5 K/uL   RBC 3.14 (L) 3.87 - 5.11 MIL/uL   Hemoglobin 9.7 (L) 12.0 - 15.0 g/dL   HCT 71.4 (L) 63.9 - 53.9 %   MCV 90.8 80.0 - 100.0 fL   MCH 30.9 26.0 - 34.0 pg   MCHC 34.0 30.0 - 36.0 g/dL   RDW 86.9 88.4 - 84.4 %   Platelets 299 150 - 400 K/uL   nRBC 0.0 0.0 - 0.2 %  ABO/Rh     Status: None   Collection Time: 05/15/24  9:06 PM  Result Value Ref Range   ABO/RH(D) O POS    No rh immune globuloin      NOT A RH IMMUNE GLOBULIN CANDIDATE, PT RH POSITIVE Performed at [facility], [address], [place], [place] [address]    US  OB Comp Less 14 Wks Result Date: 05/15/2024 EXAM: OBSTETRIC ULTRASOUND FIRST TRIMESTER TECHNIQUE: Transabdominal first trimester obstetric pelvic duplex ultrasound was performed with real-time imaging, color flow Doppler imaging. COMPARISON: Comparison with 04/18/2024. CLINICAL HISTORY: Vaginal bleeding. Abdominal pain. FINDINGS: UTERUS: Fibroid in the anterior uterus measuring 3.4 cm. The endometrium is heterogeneous with internal vascularity. The endometrium measures 2.9 cm. GESTATIONAL SAC(S): No intrauterine gestational sac. YOLK SAC: No yolk sac. EMBRYO(<11WK) /FETUS(>=11WK): No embryo. CROWN RUMP LENGTH: Not measured. RATE OF  CARDIAC ACTIVITY: No cardiac activity. RIGHT OVARY: The ovaries were not well seen due to bowel gas. LEFT OVARY: The ovaries were not well seen due to bowel gas. FREE FLUID: No free fluid. IMPRESSION: 1. The previous intrauterine gestational sac is no longer visualized compatible with miscarriage. 2. Markedly thickened (2.9 cm), heterogeneous, vascular endometrium consistent with incomplete miscarriage / retained products of conception. Electronically signed by: Norman Gatlin MD 05/15/2024 09:28 PM EST RP Workstation: [id number]    MDM - Hgb dropped from 14.3 down to 9.7 from 2 days ago. And with symptoms, plan for IV fluids and possibly blood administration.  -  Dr. Izell down to see patient for [facility] given US  results.  - Plan to admit for D&C in AM. Patient recently eating.   Assessment and Plan  - Admit to IP.  - Orders placed by Dr. Izell Claris CHRISTELLA Emilio, MSN CNM  05/15/2024, 10:35 PM      [1]  Social History Tobacco Use   Smoking status: Never   Smokeless tobacco: Never  Vaping Use   Vaping status: Never Used  Substance Use Topics   Alcohol use: No   Drug use: No  [2]  Allergies Allergen Reactions   Avocado Rash   "

## 2024-05-16 ENCOUNTER — Observation Stay (HOSPITAL_COMMUNITY)

## 2024-05-16 ENCOUNTER — Other Ambulatory Visit (HOSPITAL_COMMUNITY): Payer: Self-pay

## 2024-05-16 ENCOUNTER — Other Ambulatory Visit: Payer: Self-pay

## 2024-05-16 ENCOUNTER — Encounter (HOSPITAL_COMMUNITY): Payer: Self-pay | Admitting: Obstetrics and Gynecology

## 2024-05-16 ENCOUNTER — Encounter (HOSPITAL_COMMUNITY)
Admission: AD | Disposition: A | Discharge status: Home / Self Care | Payer: Self-pay | Source: Home / Self Care | Attending: Obstetrics & Gynecology

## 2024-05-16 LAB — GC/CHLAMYDIA PROBE AMP (~~LOC~~) NOT AT ARMC
Chlamydia: NEGATIVE
Comment: NEGATIVE
Comment: NORMAL
Neisseria Gonorrhea: NEGATIVE

## 2024-05-16 LAB — TYPE AND SCREEN
ABO/RH(D): O POS
Antibody Screen: NEGATIVE
Unit division: 0
Unit division: 0

## 2024-05-16 LAB — CBC
HCT: 21.6 % — ABNORMAL LOW (ref 36.0–46.0)
Hemoglobin: 7.3 g/dL — ABNORMAL LOW (ref 12.0–15.0)
MCH: 30.5 pg (ref 26.0–34.0)
MCHC: 33.8 g/dL (ref 30.0–36.0)
MCV: 90.4 fL (ref 80.0–100.0)
Platelets: 201 10*3/uL (ref 150–400)
RBC: 2.39 MIL/uL — ABNORMAL LOW (ref 3.87–5.11)
RDW: 13 % (ref 11.5–15.5)
WBC: 7.5 10*3/uL (ref 4.0–10.5)
nRBC: 0 % (ref 0.0–0.2)

## 2024-05-16 LAB — BPAM RBC
Blood Product Expiration Date: 202602212359
Blood Product Expiration Date: 202602222359
ISSUE DATE / TIME: 202602060749
Unit Type and Rh: 5100
Unit Type and Rh: 5100

## 2024-05-16 LAB — PREPARE RBC (CROSSMATCH)

## 2024-05-16 MED ORDER — FENTANYL CITRATE (PF) 100 MCG/2ML IJ SOLN
INTRAMUSCULAR | Status: AC
  Filled 2024-05-16: qty 2

## 2024-05-16 MED ORDER — METHYLERGONOVINE MALEATE 0.2 MG/ML IJ SOLN
INTRAMUSCULAR | Status: AC
  Filled 2024-05-16: qty 1

## 2024-05-16 MED ORDER — FENTANYL CITRATE (PF) 100 MCG/2ML IJ SOLN
25.0000 ug | INTRAMUSCULAR | Status: DC | PRN
  Administered 2024-05-16 (×2): 25 ug via INTRAVENOUS

## 2024-05-16 MED ORDER — PROPOFOL 500 MG/50ML IV EMUL
INTRAVENOUS | Status: DC | PRN
  Administered 2024-05-16: 40 mg via INTRAVENOUS
  Administered 2024-05-16: 200 ug/kg/min via INTRAVENOUS
  Administered 2024-05-16: 20 mg via INTRAVENOUS

## 2024-05-16 MED ORDER — BUPIVACAINE HCL 0.5 % IJ SOLN
INTRAMUSCULAR | Status: DC | PRN
  Administered 2024-05-16: 20 mL

## 2024-05-16 MED ORDER — ONDANSETRON 8 MG PO TBDP
8.0000 mg | ORAL_TABLET | Freq: Three times a day (TID) | ORAL | 0 refills | Status: AC | PRN
  Filled 2024-05-16: qty 8, 3d supply, fill #0

## 2024-05-16 MED ORDER — CEFAZOLIN SODIUM-DEXTROSE 2-3 GM-%(50ML) IV SOLR
INTRAVENOUS | Status: DC | PRN
  Administered 2024-05-16: 2 g via INTRAVENOUS

## 2024-05-16 MED ORDER — MISOPROSTOL 200 MCG PO TABS
ORAL_TABLET | ORAL | Status: AC
  Filled 2024-05-16: qty 5

## 2024-05-16 MED ORDER — BUPIVACAINE HCL (PF) 0.5 % IJ SOLN
INTRAMUSCULAR | Status: AC
  Filled 2024-05-16: qty 30

## 2024-05-16 MED ORDER — CHLORHEXIDINE GLUCONATE 0.12 % MT SOLN: 15.0000 mL | Freq: Once | OROMUCOSAL | Status: AC

## 2024-05-16 MED ORDER — METHYLERGONOVINE MALEATE 0.2 MG/ML IJ SOLN
INTRAMUSCULAR | Status: DC | PRN
  Administered 2024-05-16: .2 mg via INTRAMUSCULAR

## 2024-05-16 MED ORDER — METHYLERGONOVINE MALEATE 0.2 MG PO TABS
0.2000 mg | ORAL_TABLET | ORAL | 0 refills | Status: AC
  Filled 2024-05-16: qty 5, 1d supply, fill #0

## 2024-05-16 MED ORDER — OXYCODONE HCL 5 MG/5ML PO SOLN: 5.0000 mg | Freq: Once | ORAL | Status: DC | PRN

## 2024-05-16 MED ORDER — ORAL CARE MOUTH RINSE: 15.0000 mL | Freq: Once | OROMUCOSAL | Status: AC

## 2024-05-16 MED ORDER — OXYCODONE HCL 5 MG PO TABS: 5.0000 mg | ORAL_TABLET | Freq: Once | ORAL | Status: DC | PRN

## 2024-05-16 MED ORDER — CHLORHEXIDINE GLUCONATE 0.12 % MT SOLN
OROMUCOSAL | Status: AC
  Administered 2024-05-16: 15 mL via OROMUCOSAL
  Filled 2024-05-16: qty 15

## 2024-05-16 MED ORDER — FENTANYL CITRATE (PF) 100 MCG/2ML IJ SOLN
INTRAMUSCULAR | Status: DC | PRN
  Administered 2024-05-16: 50 ug via INTRAVENOUS

## 2024-05-16 MED ORDER — MIDAZOLAM HCL 2 MG/2ML IJ SOLN
INTRAMUSCULAR | Status: AC
  Filled 2024-05-16: qty 2

## 2024-05-16 MED ORDER — KETOROLAC TROMETHAMINE 30 MG/ML IJ SOLN
INTRAMUSCULAR | Status: DC | PRN
  Administered 2024-05-16: 30 mg via INTRAMUSCULAR

## 2024-05-16 MED ORDER — MIDAZOLAM HCL (PF) 2 MG/2ML IJ SOLN
INTRAMUSCULAR | Status: DC | PRN
  Administered 2024-05-16: 2 mg via INTRAVENOUS

## 2024-05-16 MED ORDER — ONDANSETRON HCL 4 MG/2ML IJ SOLN
INTRAMUSCULAR | Status: DC | PRN
  Administered 2024-05-16: 4 mg via INTRAVENOUS

## 2024-05-16 MED ORDER — LIDOCAINE 2% (20 MG/ML) 5 ML SYRINGE
INTRAMUSCULAR | Status: DC | PRN
  Administered 2024-05-16: 60 mg via INTRAVENOUS

## 2024-05-16 MED ORDER — KETOROLAC TROMETHAMINE 10 MG PO TABS
10.0000 mg | ORAL_TABLET | Freq: Three times a day (TID) | ORAL | 0 refills | Status: AC | PRN
  Filled 2024-05-16: qty 15, 5d supply, fill #0

## 2024-05-16 MED ORDER — PROPOFOL 10 MG/ML IV BOLUS
INTRAVENOUS | Status: AC
  Filled 2024-05-16: qty 20

## 2024-05-16 MED ORDER — LACTATED RINGERS IV SOLN: INTRAVENOUS | Status: DC

## 2024-05-16 MED ORDER — DEXAMETHASONE SOD PHOSPHATE PF 10 MG/ML IJ SOLN
INTRAMUSCULAR | Status: DC | PRN
  Administered 2024-05-16: 10 mg via INTRAVENOUS

## 2024-05-16 MED ORDER — SODIUM CHLORIDE 0.9% IV SOLUTION: Freq: Once | INTRAVENOUS | Status: DC

## 2024-05-16 MED ORDER — ACETAMINOPHEN 10 MG/ML IV SOLN: 1000.0000 mg | Freq: Once | INTRAVENOUS | Status: DC | PRN

## 2024-05-16 MED ORDER — OXYCODONE HCL 5 MG PO TABS
5.0000 mg | ORAL_TABLET | Freq: Once | ORAL | Status: AC
  Administered 2024-05-16: 5 mg via ORAL
  Filled 2024-05-16: qty 1

## 2024-05-16 NOTE — Op Note (Signed)
 Preoperative diagnosis:  Incomplete spontaneous pregnancy loss with retained products of conception  Postoperative diagnosis:  Same as above  Procedure:  suction and sharp uterine curettage  Surgeon:  JAYNE VONN DEL  Anesthesia:  Laryngeal mask airway  Findings:  11+ weeks spontaneous  pregnancy loss at home with retained POC seen on sonogram  Description of operation:  The patient was taken to the operating room and placed in the supine position.  She underwent laryngeal mask airway general anesthesia.  The patient was placed in the dorsal lithotomy position.  The vagina was prepped and draped in the usual sterile fashion.  A Graves speculum was placed.  The anterior cervix was grasped with a single-tooth tenaculum.  The cervix was dilated serially with Hegar dilators.  A #9 curved suction curette was placed in the uterus.  The suction pressure was placed at 60 and several passes were made.  All of the intrauterine contents were removed.  The sharp curette was used x1 to feel uterine crie in all areas.  The patient was given Methergine  0.2 mg IM x1.  There was good hemostasis.  The patient was given 2 grams of Ancef  preoperatively.  The patient was given Toradol  30 mg IV preoperatively.  Estimated blood loss for the procedure was 50 cc.  The patient was awakened from anesthesia taken to the recovery room in good stable condition.  All counts were correct x3.  Vonn DEL Jayne, MD 05/16/2024 11:46 AM

## 2024-05-16 NOTE — H&P (Signed)
 Obstetrics & Gynecology H&P  Date of Admission: 05/16/2024   Requesting Provider: MAU  Primary OBGYN: [facility] Primary Care Provider: Chinita Hoy CROME  Reason for Admission: anemia, VB, retained products of conception  History of Present Illness: Marcia Hanson  is a 44 y.o. [id number] (Patient's last menstrual period was 02/24/2024 (exact date).), with the above chief complaint. Past medical history is significant for AMA  Patient started having heavy VB and came to MAU for evaluation; by earlier u/s showing a SLIUP at 8wks, pt is around 11/5 weeks. MAU u/s shows incomplete AB with no gestational sac but thickened and vascular endometrial lining measuring 29mm. Hgb 9.7 from 14.3 with her NOB labs and visit three days ago. Patient initially had heavy VB on admission, which is now minimal.   ROS: A 12-point review of systems was performed and negative, except as stated in the above HPI.  OBGYN History: As per HPI. OB History  Gravida Para Term Preterm AB Living  5 0 0 0 4 0  SAB IAB Ectopic Multiple Live Births  4 0 0 0 0    # Outcome Date GA Lbr Len/2nd Weight Sex Type Anes PTL Lv  5 Current           4 SAB 05/28/22 [redacted]w[redacted]d    SAB     3 SAB 2019 [redacted]w[redacted]d    SAB        Birth Comments: twin gestation  2 SAB 09/2016 [redacted]w[redacted]d    SAB        Birth Comments: D&C  1 SAB 2016 [redacted]w[redacted]d          Past Medical History: Past Medical History:  Diagnosis Date   Fatigue    Hypokalemia    Iron deficiency    Syncope    Past Surgical History: Past Surgical History:  Procedure Laterality Date   DILATION AND CURETTAGE OF UTERUS     NO PAST SURGERIES     Family History:  Family History  Problem Relation Age of Onset   Hypertension Mother    Arthritis Father    Diabetes Maternal Grandmother    Hypertension Maternal Grandmother    Stroke Maternal Grandfather    Diabetes Maternal Grandfather    Hypertension Maternal Grandfather    Dementia Maternal Grandfather    Social History:  Social  History   Socioeconomic History   Marital status: Single    Spouse name: Not on file   Number of children: Not on file   Years of education: Not on file   Highest education level: Not on file  Occupational History   Not on file  Tobacco Use   Smoking status: Never   Smokeless tobacco: Never  Vaping Use   Vaping status: Never Used  Substance and Sexual Activity   Alcohol use: No   Drug use: No   Sexual activity: Yes    Birth control/protection: None    Comment: in the past used pill, nexplanon , patch  Other Topics Concern   Not on file  Social History Narrative   Not on file   Social Drivers of Health   Tobacco Use: Low Risk (05/15/2024)   Patient History    Smoking Tobacco Use: Never    Smokeless Tobacco Use: Never    Passive Exposure: Not on file  Financial Resource Strain: Low Risk (04/14/2024)   Received from [facility]   Overall Financial Resource Strain (CARDIA)    How hard is it for you to pay for the very basics  like food, housing, medical care, and heating?: Not very hard  Food Insecurity: No Food Insecurity (05/13/2024)   Epic    Worried About Programme Researcher, Broadcasting/film/video in the Last Year: Never true    Ran Out of Food in the Last Year: Never true  Transportation Needs: No Transportation Needs (04/14/2024)   Received from [facility]    In the past 12 months, has lack of transportation kept you from medical appointments or from getting medications?: No    In the past 12 months, has lack of transportation kept you from meetings, work, or from getting things needed for daily living?: No  Physical Activity: Sufficiently Active (04/14/2024)   Received from [facility]   Exercise Vital Sign    On average, how many days per week do you engage in moderate to strenuous exercise (like a brisk walk)?: 4 days    On average, how many minutes do you engage in exercise at this level?: 60 min  Stress: No Stress Concern Present (04/14/2024)   Received from [facility] - Occupational Stress Questionnaire    Do you feel stress - tense, restless, nervous, or anxious, or unable to sleep at night because your mind is troubled all the time - these days?: Only a little  Social Connections: Socially Integrated (04/14/2024)   Received from [facility]   Social Network    How would you rate your social network (family, work, friends)?: Good participation with social networks  Intimate Partner Violence: Not At Risk (04/14/2024)   Received from [facility]   HITS    Over the last 12 months how often did your partner physically hurt you?: Never    Over the last 12 months how often did your partner insult you or talk down to you?: Never    Over the last 12 months how often did your partner threaten you with physical harm?: Never    Over the last 12 months how often did your partner scream or curse at you?: Never  Depression (PHQ2-9): Medium Risk (05/13/2024)   Depression (PHQ2-9)    PHQ-2 Score: 6  Alcohol Screen: Not on file  Housing: Low Risk (04/14/2024)   Received from [facility]    In the last 12 months, was there a time when you were not able to pay the mortgage or rent on time?: No    In the past 12 months, how many times have you moved where you were living?: 0    At any time in the past 12 months, were you homeless or living in a shelter (including now)?: No  Utilities: Not At Risk (04/14/2024)   Received from [facility]    In the past 12 months has the electric, gas, oil, or water company threatened to shut off services in your home?: No  Health Literacy: Not on file   Allergy: Allergies[1] Current Outpatient Medications: Medications Prior to Admission  Medication Sig Dispense Refill Last Dose/Taking   ibuprofen  (ADVIL ) 800 MG tablet Take 800 mg by mouth every 8 (eight) hours as needed.   05/15/2024 at  5:00 PM   ondansetron  (ZOFRAN -ODT) 4 MG disintegrating tablet Take 4 mg by mouth every 8  (eight) hours as needed for nausea or vomiting.   05/15/2024 at  5:00 PM   aspirin  EC 81 MG tablet Take 1 tablet (81 mg total) by mouth at bedtime. Start taking when you  are [redacted] weeks pregnant for rest of pregnancy for prevention of preeclampsia 300 tablet 2    Blood Pressure Monitoring (BLOOD PRESSURE KIT) DEVI 1 Device by Does not apply route once a week. 1 each 0    cholecalciferol (VITAMIN D3) 25 MCG (1000 UNIT) tablet Take 1,000 Units by mouth daily.      cyanocobalamin (VITAMIN B12) 500 MCG tablet Take 500 mcg by mouth daily.      folic acid (FOLVITE) 1 MG tablet Take 1 mg by mouth daily.      Prenatal MV & Min w/FA-DHA (PRENATAL GUMMIES PO) Take 2 tablets by mouth daily at 6 (six) AM.      triamcinolone  (KENALOG ) 0.025 % ointment Apply 1 Application topically 2 (two) times daily.      Vitamin D, Ergocalciferol, (DRISDOL) 1.25 MG (50000 UNIT) CAPS capsule Take 50,000 Units by mouth every 7 (seven) days.      Hospital Medications: Current Facility-Administered Medications  Medication Dose Route Frequency Provider Last Rate Last Admin   lactated ringers  infusion   Intravenous Continuous Izell Harari, MD       ondansetron  (ZOFRAN ) tablet 4 mg  4 mg Oral Q6H PRN Izell Harari, MD       Or   ondansetron  (ZOFRAN ) injection 4 mg  4 mg Intravenous Q6H PRN Izell Harari, MD        Physical Exam:   Current Vital Signs 24h Vital Sign Ranges  T 98.8 F (37.1 C) Temp  Avg: 98.8 F (37.1 C)  Min: 98.8 F (37.1 C)  Max: 98.8 F (37.1 C)  BP 117/68 BP  Min: 110/68  Max: 122/72  HR 86 Pulse  Avg: 81.7  Min: 73  Max: 86  RR 17 Resp  Avg: 17  Min: 17  Max: 17  SaO2 100 %   SpO2  Avg: 100 %  Min: 100 %  Max: 100 %       24 Hour I/O Current Shift I/O  Time Ins Outs No intake/output data recorded. No intake/output data recorded.   Patient Vitals for the past 24 hrs:  BP Temp Pulse Resp SpO2 Height Weight  05/15/24 2345 117/68 -- 86 -- 100 % -- --  05/15/24 2330 118/72 -- 83 -- 100 % --  --  05/15/24 2319 110/68 -- 78 -- 100 % -- --  05/15/24 2307 122/72 -- 73 -- 100 % -- --  05/15/24 2251 111/73 -- 84 -- -- -- --  05/15/24 2001 111/80 98.8 F (37.1 C) 86 17 100 % 5' 5 (1.651 m) 62.6 kg    Body mass index is 22.96 kg/m. General appearance: Well nourished, well developed female in no acute distress.  Respiratory:  Normal respiratory effort Abdomen: soft, nttp Neuro/Psych:  Normal mood and affect.  Skin:  Warm and dry.  Extremities: no clubbing, cyanosis, or edema.   Laboratory:  Recent Labs  Lab 05/13/24 1544 05/15/24 2106  WBC 6.8 11.2*  HGB 14.3 9.7*  HCT 43.9 28.5*  PLT 280 299   O POS  Imaging:  Narrative & Impression  EXAM: OBSTETRIC ULTRASOUND FIRST TRIMESTER   TECHNIQUE: Transabdominal first trimester obstetric pelvic duplex ultrasound was performed with real-time imaging, color flow Doppler imaging.   COMPARISON: Comparison with 04/18/2024.   CLINICAL HISTORY: Vaginal bleeding. Abdominal pain.   FINDINGS:   UTERUS: Fibroid in the anterior uterus measuring 3.4 cm. The endometrium is heterogeneous with internal vascularity. The endometrium measures 2.9 cm.   GESTATIONAL SAC(S): No intrauterine gestational sac.  YOLK SAC: No yolk sac.   EMBRYO(<11WK) /FETUS(>=11WK): No embryo.   CROWN RUMP LENGTH: Not measured.   RATE OF CARDIAC ACTIVITY: No cardiac activity.   RIGHT OVARY: The ovaries were not well seen due to bowel gas.   LEFT OVARY: The ovaries were not well seen due to bowel gas.   FREE FLUID: No free fluid.       IMPRESSION: 1. The previous intrauterine gestational sac is no longer visualized compatible with miscarriage. 2. Markedly thickened (2.9 cm), heterogeneous, vascular endometrium consistent with incomplete miscarriage / retained products of conception.   Electronically signed by: Norman Gatlin MD 05/15/2024 09:28 PM EST RP Workstation: [id number]    Assessment: patient stable  Plan: I went  to talk to the patient and offer her a d&c now but she is currently eating [employer]. I told her that given her anemia and good amount of POCs left that I don't recommend medications or expectant management and recommend a d&c, which she is amenable to. I told her that if her bleeding picks up again then we'll have to do the surgery before the 8 hours since last PO. Patient amenable to plan  OR called and posted for the morning. Patient consented for suction d&c. D/w her plan for d/c to home after surgery.   Total time taking care of the patient was 45 minutes, with greater than 50% of the time spent in face to face interaction with the patient.  Bebe Izell Raddle MD Attending Center for [facility] (Faculty Practice) GYN Consult Phone: [phone] (M-F, 0800-1700) & [phone] (Off hours, weekends, holidays)     [1]  Allergies Allergen Reactions   Avocado Rash

## 2024-05-16 NOTE — Progress Notes (Signed)
 GYN Note Patient with scant bleeding on pad overnight but H/H lower and pt dizzy with ambulation. Will write for a unit of blood  Bebe Izell Raddle MD Attending Center for [employer] (Faculty Practice) 05/16/2024 Time: 0700

## 2024-05-16 NOTE — Transfer of Care (Signed)
 Immediate Anesthesia Transfer of Care Note  Patient: Marcia Hanson   Procedure(s) Performed: DILATION AND EVACUATION, UTERUS  Patient Location: PACU  Anesthesia Type:MAC  Level of Consciousness: awake, alert , and oriented  Airway & Oxygen Therapy: Patient connected to face mask oxygen  Post-op Assessment: Report given to RN, Post -op Vital signs reviewed and stable, and Patient moving all extremities  Post vital signs: Reviewed and stable  Last Vitals:  Vitals Value Taken Time  BP 112/65 05/16/24 11:35  Temp    Pulse 88 05/16/24 11:37  Resp 15 05/16/24 11:37  SpO2 97 % 05/16/24 11:37  Vitals shown include unfiled device data.  Last Pain:  Vitals:   05/16/24 1041  TempSrc:   PainSc: 0-No pain      Patients Stated Pain Goal: 0 (05/15/24 2005)  Complications: No notable events documented.

## 2024-05-16 NOTE — Progress Notes (Signed)
 Discussed surgery with patient this am and all questions answered.  Likely will discharge from PACU  Pt is NPO and awaiting call from OR  Marcia VEAR Inch, MD 05/16/2024 10:00 AM

## 2024-05-16 NOTE — H&P (Incomplete)
 SABRA

## 2024-06-10 ENCOUNTER — Encounter: Payer: Self-pay | Admitting: Advanced Practice Midwife

## 2024-07-08 ENCOUNTER — Encounter: Admitting: Family Medicine

## 2024-07-10 ENCOUNTER — Ambulatory Visit

## 2024-07-10 ENCOUNTER — Other Ambulatory Visit
